# Patient Record
Sex: Female | Born: 1965
Health system: Southern US, Community
[De-identification: ages and names within clinical notes are randomized; demographics above are authoritative.]

## PROBLEM LIST (undated history)

## (undated) DIAGNOSIS — F32A Depression, unspecified: Secondary | ICD-10-CM

## (undated) DIAGNOSIS — F419 Anxiety disorder, unspecified: Secondary | ICD-10-CM

## (undated) HISTORY — PX: FOREARM FRACTURE SURGERY: SHX649

## (undated) HISTORY — PX: BREAST ENHANCEMENT SURGERY: SHX7

## (undated) HISTORY — DX: Depression, unspecified: F32.A

## (undated) HISTORY — PX: WISDOM TOOTH EXTRACTION: SHX21

## (undated) HISTORY — PX: TONSILLECTOMY: SUR1361

---

## 1993-03-18 HISTORY — PX: FOREARM FRACTURE SURGERY: SHX649

## 1998-04-05 ENCOUNTER — Inpatient Hospital Stay (HOSPITAL_COMMUNITY): Admission: AD | Admit: 1998-04-05 | Discharge: 1998-04-05 | Payer: Self-pay | Admitting: Obstetrics and Gynecology

## 1998-05-22 ENCOUNTER — Inpatient Hospital Stay (HOSPITAL_COMMUNITY): Admission: AD | Admit: 1998-05-22 | Discharge: 1998-05-22 | Payer: Self-pay | Admitting: Obstetrics and Gynecology

## 1998-05-24 ENCOUNTER — Inpatient Hospital Stay (HOSPITAL_COMMUNITY): Admission: AD | Admit: 1998-05-24 | Discharge: 1998-05-24 | Payer: Self-pay | Admitting: Obstetrics and Gynecology

## 1998-05-25 ENCOUNTER — Inpatient Hospital Stay (HOSPITAL_COMMUNITY): Admission: AD | Admit: 1998-05-25 | Discharge: 1998-05-27 | Payer: Self-pay | Admitting: *Deleted

## 1998-07-12 ENCOUNTER — Other Ambulatory Visit: Admission: RE | Admit: 1998-07-12 | Discharge: 1998-07-12 | Payer: Self-pay | Admitting: Obstetrics and Gynecology

## 1999-07-04 ENCOUNTER — Other Ambulatory Visit: Admission: RE | Admit: 1999-07-04 | Discharge: 1999-07-04 | Payer: Self-pay | Admitting: Obstetrics and Gynecology

## 1999-09-21 ENCOUNTER — Other Ambulatory Visit: Admission: RE | Admit: 1999-09-21 | Discharge: 1999-09-21 | Payer: Self-pay | Admitting: *Deleted

## 2000-07-03 ENCOUNTER — Other Ambulatory Visit: Admission: RE | Admit: 2000-07-03 | Discharge: 2000-07-03 | Payer: Self-pay | Admitting: *Deleted

## 2001-07-06 ENCOUNTER — Other Ambulatory Visit: Admission: RE | Admit: 2001-07-06 | Discharge: 2001-07-06 | Payer: Self-pay | Admitting: Obstetrics and Gynecology

## 2002-07-09 ENCOUNTER — Other Ambulatory Visit: Admission: RE | Admit: 2002-07-09 | Discharge: 2002-07-09 | Payer: Self-pay | Admitting: Obstetrics and Gynecology

## 2003-07-26 ENCOUNTER — Other Ambulatory Visit: Admission: RE | Admit: 2003-07-26 | Discharge: 2003-07-26 | Payer: Self-pay | Admitting: Obstetrics and Gynecology

## 2008-04-20 ENCOUNTER — Inpatient Hospital Stay (HOSPITAL_COMMUNITY): Admission: EM | Admit: 2008-04-20 | Discharge: 2008-04-22 | Payer: Self-pay | Admitting: Emergency Medicine

## 2008-04-21 ENCOUNTER — Ambulatory Visit: Payer: Self-pay | Admitting: Infectious Diseases

## 2010-04-07 ENCOUNTER — Encounter: Payer: Self-pay | Admitting: Emergency Medicine

## 2010-07-03 LAB — CBC
HCT: 36.5 % (ref 36.0–46.0)
Hemoglobin: 12.8 g/dL (ref 12.0–15.0)
Hemoglobin: 13.5 g/dL (ref 12.0–15.0)
MCHC: 35 g/dL (ref 30.0–36.0)
MCHC: 35 g/dL (ref 30.0–36.0)
MCV: 86 fL (ref 78.0–100.0)
RBC: 4.5 MIL/uL (ref 3.87–5.11)
RDW: 12.6 % (ref 11.5–15.5)

## 2010-07-03 LAB — DIFFERENTIAL
Basophils Absolute: 0 10*3/uL (ref 0.0–0.1)
Basophils Relative: 0 % (ref 0–1)
Basophils Relative: 0 % (ref 0–1)
Eosinophils Absolute: 0 10*3/uL (ref 0.0–0.7)
Eosinophils Relative: 0 % (ref 0–5)
Lymphocytes Relative: 4 % — ABNORMAL LOW (ref 12–46)
Monocytes Absolute: 0.2 10*3/uL (ref 0.1–1.0)
Monocytes Relative: 4 % (ref 3–12)
Neutro Abs: 19.6 10*3/uL — ABNORMAL HIGH (ref 1.7–7.7)

## 2010-07-03 LAB — WOUND CULTURE: Culture: NORMAL

## 2010-07-03 LAB — BASIC METABOLIC PANEL
BUN: 4 mg/dL — ABNORMAL LOW (ref 6–23)
CO2: 23 mEq/L (ref 19–32)
Calcium: 8.7 mg/dL (ref 8.4–10.5)
Chloride: 107 mEq/L (ref 96–112)
Creatinine, Ser: 0.67 mg/dL (ref 0.4–1.2)
GFR calc Af Amer: >60 mL/min (ref >60)
GFR calc non Af Amer: >60 mL/min (ref >60)
Glucose, Bld: 94 mg/dL (ref 70–99)
Potassium: 3.5 mEq/L (ref 3.5–5.1)
Sodium: 139 mEq/L (ref 135–145)

## 2010-07-03 LAB — RAPID STREP SCREEN (MED CTR MEBANE ONLY): Streptococcus, Group A Screen (Direct): NEGATIVE

## 2010-07-31 NOTE — Consult Note (Signed)
NAMEJANIYA, MILLIRONS                 ACCOUNT NO.:  [id number]   MEDICAL RECORD NO.:  [id number]          PATIENT TYPE:  INP   LOCATION:  3307                         FACILITY:  [facility]   PHYSICIAN:  Zola Button T. Lazarus Salines, M.D. DATE OF BIRTH:  1965-05-09   DATE OF CONSULTATION:  04/20/2008  DATE OF DISCHARGE:                                 CONSULTATION   CHIEF COMPLAINT:  Severe sore throat.   HISTORY:  A 45 year old white female developed a rapidly progressive  sore throat starting late yesterday.  By this morning, she got basically  no sleep overnight and could not swallow and went to Urgent Care.  She  received a shot of Rocephin.  At that time, a white count of 17,000 was  noted.  She was told to come back if things seemed to be getting worse.  Over the course of the day, the pain was worse and she came back to  Urgent Care and she was transferred to the [facility] Emergency Room for  further assessment.  She has never had a prior similar problem.  She has  had some fevers, but undocumented.  No trauma to the throat.  She does  not smoke.  She does not have any intrinsic lung problems.  She does not  have any immune compromise including diabetes, prednisone therapy, or  HIV status.  She has no medical allergies.  She is taking no current  medications.  No trauma to throat including foreign body ingestion,  intubation, or endoscopy.  She was evaluated in the emergency room where  her white count was 21,000.  Lateral soft tissue of the neck was clear  and a chest x-ray was also read as clear.  She did receive some IV  fluids for hydration this morning at Urgent Care and again this evening.  The emergency room physicians called me for possible adult epiglottitis.  A strep screen of her throat was negative and throat culture is pending.   No known allergies.   No current medications.   She has had a previous tonsillectomy, adenoidectomy, and also a surgical  repair of a right forearm  fracture.   SOCIAL HISTORY:  She is married with children.   FAMILY HISTORY:  Noncontributory.   PHYSICAL EXAMINATION:  This is a thin, anxious-appearing, adult white  female.  She is slightly warm to touch.  Her voice is slightly hoarse,  but she is not having labored or stridorous respirations.  She does  cough and clear her secretions and is unable to swallow her own saliva.  Mental status is sharp.  She hears well in conversational speech.  Respirations unlabored through mouth and nose.  The head is atraumatic  and neck supple.  Cranial nerves intact.  Ear canals are clear with  normal aerated drums.  Anterior nose shows some yellow mucopus in the  left nose.  Oral cavity is clear with teeth in good repair and moist  membranes.  Oropharynx is without erythema or exudate.  Neck is slightly  tender in the region of the laryngotracheal complex, but without  adenopathy.  Following 10 mL of 1% plain Xylocaine to both sides of the nose for  topical anesthesia, the flexible laryngoscope was gently introduced  through the right side.  The nasopharynx is clear, although there is  some yellow mucopus coming into the pharynx from the left side.  The  oropharynx is clear including base of tongue.  Hypopharynx shows  erythema and moderate swelling of the epiglottis and of the arytenoids,  right more so than left.  There may be some superficial ulceration over  the right arytenoid.  The vocal cords are mobile and the airway is good.  Some pooling in the piriformis.   IMPRESSION:  Adult epiglottitis/supraglottis without eminent airway  threat.   PLAN:  Throat culture is pending.  We will admit for airway observation  and IV antibiosis.  I discussed her case with Dr. Sampson Goon of  Infectious Disease, who recommends vancomycin plus ceftriaxone.  We will  allow her to have an occasional ice chip, but nothing by mouth further  than that.  We will recheck a CBC in the morning.      Gloris [place]. Lazarus Salines, M.D.  Electronically Signed     KTW/MEDQ  D:  04/20/2008  T:  04/21/2008  Job:  [id number]   cc:   Annye Rusk, MD  Payton Spark. Effie Shy, M.D.

## 2017-06-06 DIAGNOSIS — Z6823 Body mass index (BMI) 23.0-23.9, adult: Secondary | ICD-10-CM | POA: Diagnosis not present

## 2017-06-06 DIAGNOSIS — Z01419 Encounter for gynecological examination (general) (routine) without abnormal findings: Secondary | ICD-10-CM | POA: Diagnosis not present

## 2017-10-22 ENCOUNTER — Encounter (HOSPITAL_COMMUNITY): Payer: Self-pay

## 2017-10-22 ENCOUNTER — Other Ambulatory Visit: Payer: Self-pay

## 2017-10-22 ENCOUNTER — Emergency Department (HOSPITAL_COMMUNITY): Payer: 59

## 2017-10-22 ENCOUNTER — Observation Stay (HOSPITAL_COMMUNITY)
Admission: EM | Admit: 2017-10-22 | Discharge: 2017-10-24 | Disposition: A | Discharge status: Home / Self Care | Payer: 59 | Attending: Family Medicine | Admitting: Family Medicine

## 2017-10-22 DIAGNOSIS — R5381 Other malaise: Secondary | ICD-10-CM | POA: Diagnosis present

## 2017-10-22 DIAGNOSIS — R0902 Hypoxemia: Secondary | ICD-10-CM | POA: Diagnosis not present

## 2017-10-22 DIAGNOSIS — J189 Pneumonia, unspecified organism: Secondary | ICD-10-CM | POA: Diagnosis not present

## 2017-10-22 DIAGNOSIS — R740 Nonspecific elevation of levels of transaminase and lactic acid dehydrogenase [LDH]: Secondary | ICD-10-CM

## 2017-10-22 DIAGNOSIS — R7989 Other specified abnormal findings of blood chemistry: Secondary | ICD-10-CM | POA: Diagnosis not present

## 2017-10-22 DIAGNOSIS — R079 Chest pain, unspecified: Secondary | ICD-10-CM | POA: Diagnosis not present

## 2017-10-22 DIAGNOSIS — F419 Anxiety disorder, unspecified: Secondary | ICD-10-CM

## 2017-10-22 DIAGNOSIS — J181 Lobar pneumonia, unspecified organism: Secondary | ICD-10-CM | POA: Diagnosis not present

## 2017-10-22 DIAGNOSIS — R7401 Elevation of levels of liver transaminase levels: Secondary | ICD-10-CM

## 2017-10-22 DIAGNOSIS — R42 Dizziness and giddiness: Secondary | ICD-10-CM | POA: Diagnosis not present

## 2017-10-22 DIAGNOSIS — E876 Hypokalemia: Secondary | ICD-10-CM

## 2017-10-22 DIAGNOSIS — R0602 Shortness of breath: Secondary | ICD-10-CM | POA: Diagnosis not present

## 2017-10-22 HISTORY — DX: Anxiety disorder, unspecified: F41.9

## 2017-10-22 LAB — CBC WITH DIFFERENTIAL/PLATELET
BASOS PCT: 0 %
Basophils Absolute: 0 10*3/uL (ref 0.0–0.1)
Eosinophils Absolute: 0 10*3/uL (ref 0.0–0.7)
Eosinophils Relative: 0 %
HCT: 38.6 % (ref 36.0–46.0)
Hemoglobin: 13.7 g/dL (ref 12.0–15.0)
Lymphocytes Relative: 7 %
Lymphs Abs: 0.8 10*3/uL (ref 0.7–4.0)
MCH: 29 pg (ref 26.0–34.0)
MCHC: 35.5 g/dL (ref 30.0–36.0)
MCV: 81.8 fL (ref 78.0–100.0)
MONO ABS: 1 10*3/uL (ref 0.1–1.0)
MONOS PCT: 9 %
Neutro Abs: 8.4 10*3/uL — ABNORMAL HIGH (ref 1.7–7.7)
Neutrophils Relative %: 84 %
PLATELETS: 270 10*3/uL (ref 150–400)
RBC: 4.72 MIL/uL (ref 3.87–5.11)
RDW: 12.8 % (ref 11.5–15.5)
WBC: 10.2 10*3/uL (ref 4.0–10.5)

## 2017-10-22 LAB — COMPREHENSIVE METABOLIC PANEL
ALBUMIN: 2.9 g/dL — AB (ref 3.5–5.0)
ALT: 49 U/L — ABNORMAL HIGH (ref 0–44)
ANION GAP: 12 (ref 5–15)
AST: 53 U/L — ABNORMAL HIGH (ref 15–41)
Alkaline Phosphatase: 79 U/L (ref 38–126)
BUN: 13 mg/dL (ref 6–20)
CHLORIDE: 95 mmol/L — AB (ref 98–111)
CO2: 27 mmol/L (ref 22–32)
Calcium: 9 mg/dL (ref 8.9–10.3)
Creatinine, Ser: 0.61 mg/dL (ref 0.44–1.00)
GFR calc Af Amer: >60 mL/min (ref >60)
GFR calc non Af Amer: >60 mL/min (ref >60)
GLUCOSE: 125 mg/dL — AB (ref 70–99)
POTASSIUM: 3.2 mmol/L — AB (ref 3.5–5.1)
Sodium: 134 mmol/L — ABNORMAL LOW (ref 135–145)
TOTAL PROTEIN: 6.9 g/dL (ref 6.5–8.1)
Total Bilirubin: 0.6 mg/dL (ref 0.3–1.2)

## 2017-10-22 LAB — I-STAT BETA HCG BLOOD, ED (MC, WL, AP ONLY): I-stat hCG, quantitative: 58.2 m[IU]/mL — ABNORMAL HIGH (ref <5)

## 2017-10-22 LAB — I-STAT TROPONIN, ED
TROPONIN I, POC: 0 ng/mL (ref 0.00–0.08)
TROPONIN I, POC: 0 ng/mL (ref 0.00–0.08)

## 2017-10-22 LAB — COOXEMETRY PANEL
Carboxyhemoglobin: 0.9 % (ref 0.5–1.5)
Methemoglobin: 0.7 % (ref 0.0–1.5)
O2 SAT: 84.8 %
Total hemoglobin: 13.7 g/dL (ref 12.0–16.0)

## 2017-10-22 LAB — HCG, SERUM, QUALITATIVE: PREG SERUM: NEGATIVE

## 2017-10-22 LAB — LIPASE, BLOOD: Lipase: 21 U/L (ref 11–51)

## 2017-10-22 LAB — D-DIMER, QUANTITATIVE (NOT AT ARMC): D DIMER QUANT: 2.96 ug{FEU}/mL — AB (ref 0.00–0.50)

## 2017-10-22 LAB — I-STAT CG4 LACTIC ACID, ED
Lactic Acid, Venous: 0.81 mmol/L (ref 0.5–1.9)
Lactic Acid, Venous: 0.63 mmol/L (ref 0.5–1.9)

## 2017-10-22 MED ORDER — ONDANSETRON HCL 4 MG/2ML IJ SOLN
4.0000 mg | Freq: Four times a day (QID) | INTRAMUSCULAR | Status: DC | PRN
  Administered 2017-10-22 – 2017-10-23 (×3): 4 mg via INTRAVENOUS
  Filled 2017-10-22 (×3): qty 2

## 2017-10-22 MED ORDER — ACETAMINOPHEN 325 MG PO TABS
650.0000 mg | ORAL_TABLET | Freq: Once | ORAL | Status: AC
  Administered 2017-10-22: 650 mg via ORAL
  Filled 2017-10-22: qty 2

## 2017-10-22 MED ORDER — IOPAMIDOL (ISOVUE-370) INJECTION 76%
INTRAVENOUS | Status: AC
  Filled 2017-10-22: qty 100

## 2017-10-22 MED ORDER — SODIUM CHLORIDE 0.9 % IV BOLUS
1000.0000 mL | Freq: Once | INTRAVENOUS | Status: AC
  Administered 2017-10-22: 1000 mL via INTRAVENOUS

## 2017-10-22 MED ORDER — SODIUM CHLORIDE 0.9 % IV SOLN
500.0000 mg | Freq: Once | INTRAVENOUS | Status: AC
  Administered 2017-10-22: 500 mg via INTRAVENOUS
  Filled 2017-10-22: qty 500

## 2017-10-22 MED ORDER — ZOLPIDEM TARTRATE 5 MG PO TABS
5.0000 mg | ORAL_TABLET | Freq: Every evening | ORAL | Status: DC | PRN
  Administered 2017-10-22 – 2017-10-23 (×2): 5 mg via ORAL
  Filled 2017-10-22 (×2): qty 1

## 2017-10-22 MED ORDER — ACETAMINOPHEN 325 MG PO TABS
650.0000 mg | ORAL_TABLET | Freq: Four times a day (QID) | ORAL | Status: DC | PRN
  Administered 2017-10-22 – 2017-10-24 (×6): 650 mg via ORAL
  Filled 2017-10-22 (×6): qty 2

## 2017-10-22 MED ORDER — IOPAMIDOL (ISOVUE-370) INJECTION 76%
100.0000 mL | Freq: Once | INTRAVENOUS | Status: AC | PRN
  Administered 2017-10-22: 100 mL via INTRAVENOUS

## 2017-10-22 MED ORDER — SODIUM CHLORIDE 0.9 % IV SOLN
1.0000 g | INTRAVENOUS | Status: DC
  Administered 2017-10-23: 1 g via INTRAVENOUS
  Filled 2017-10-22: qty 1
  Filled 2017-10-22: qty 10

## 2017-10-22 MED ORDER — POTASSIUM CHLORIDE CRYS ER 20 MEQ PO TBCR
40.0000 meq | EXTENDED_RELEASE_TABLET | Freq: Once | ORAL | Status: AC
  Administered 2017-10-22: 40 meq via ORAL
  Filled 2017-10-22: qty 2

## 2017-10-22 MED ORDER — SODIUM CHLORIDE 0.9% FLUSH: 3.0000 mL | INTRAVENOUS | Status: DC | PRN

## 2017-10-22 MED ORDER — SODIUM CHLORIDE 0.9 % IV SOLN
1.0000 g | Freq: Once | INTRAVENOUS | Status: AC
  Administered 2017-10-22: 1 g via INTRAVENOUS
  Filled 2017-10-22: qty 1

## 2017-10-22 MED ORDER — ENOXAPARIN SODIUM 40 MG/0.4ML ~~LOC~~ SOLN
40.0000 mg | SUBCUTANEOUS | Status: DC
  Administered 2017-10-22 – 2017-10-23 (×2): 40 mg via SUBCUTANEOUS
  Filled 2017-10-22 (×2): qty 0.4

## 2017-10-22 MED ORDER — IOPAMIDOL (ISOVUE-370) INJECTION 76%: 100.0000 mL | Freq: Once | INTRAVENOUS | Status: DC | PRN

## 2017-10-22 MED ORDER — SODIUM CHLORIDE 0.9 % IV SOLN
INTRAVENOUS | Status: DC
  Administered 2017-10-22 – 2017-10-23 (×3): via INTRAVENOUS

## 2017-10-22 MED ORDER — AZITHROMYCIN 250 MG PO TABS
500.0000 mg | ORAL_TABLET | ORAL | Status: DC
  Administered 2017-10-23: 500 mg via ORAL
  Filled 2017-10-22: qty 2

## 2017-10-22 MED ORDER — ONDANSETRON HCL 4 MG PO TABS: 4.0000 mg | ORAL_TABLET | Freq: Four times a day (QID) | ORAL | Status: DC | PRN

## 2017-10-22 MED ORDER — SODIUM CHLORIDE 0.9 % IV SOLN: 250.0000 mL | INTRAVENOUS | Status: DC | PRN

## 2017-10-22 MED ORDER — HYDROCODONE-ACETAMINOPHEN 5-325 MG PO TABS
1.0000 | ORAL_TABLET | Freq: Once | ORAL | Status: AC
  Administered 2017-10-22: 1 via ORAL
  Filled 2017-10-22: qty 1

## 2017-10-22 MED ORDER — ALPRAZOLAM 1 MG PO TABS
1.0000 mg | ORAL_TABLET | Freq: Two times a day (BID) | ORAL | Status: DC | PRN
  Administered 2017-10-22 – 2017-10-23 (×3): 1 mg via ORAL
  Filled 2017-10-22 (×3): qty 1

## 2017-10-22 MED ORDER — SODIUM CHLORIDE 0.9% FLUSH
3.0000 mL | Freq: Two times a day (BID) | INTRAVENOUS | Status: DC
  Administered 2017-10-23: 3 mL via INTRAVENOUS

## 2017-10-22 NOTE — ED Notes (Signed)
Pt notified of urine sample.  Pt will try to urinate again after fluid bolus.

## 2017-10-22 NOTE — ED Notes (Addendum)
Unable to give report to floor at this time.  

## 2017-10-22 NOTE — ED Notes (Signed)
Pt reminded of ned for urine specimen
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
## 2017-10-22 NOTE — Progress Notes (Signed)
Attempted to call to get report from ED nurse -- first time I was kept on hold.  Second time, Diplomatic Services operational officersecretary reported that nurse was involved with a critically ill patient and unable to give report.
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
## 2017-10-22 NOTE — ED Notes (Signed)
Patient is resting comfortably. 

## 2017-10-22 NOTE — ED Notes (Signed)
Pt and pt's husband states that home health nurse is concerned pt has "general organ failure".   

## 2017-10-22 NOTE — ED Notes (Signed)
Bed: WA07 Expected date:  Expected time:  Means of arrival:  Comments: EMS- general weakness

## 2017-10-22 NOTE — ED Notes (Signed)
ED TO INPATIENT HANDOFF REPORT  Name/Age/Gender Melissa Lucas 52 y.o. female  Code Status    Code Status Orders  (From admission, onward)        Start     Ordered   10/22/17 1832  Full code  Continuous     10/22/17 1831    Code Status History    This patient has a current code status but no historical code status.      Home/SNF/Other Home  Chief Complaint weakness   Level of Care/Admitting Diagnosis ED Disposition    ED Disposition Condition Comment   Admit  Hospital Area: [facility] [[id number]]  Level of Care: Med-Surg [16]  Diagnosis: Right lower lobe pneumonia [facility]) [[id number]]  Admitting Physician: Mariel Aloe [phone]  Attending Physician: Mariel Aloe [phone]  PT Class (Do Not Modify): Observation [104]  PT Acc Code (Do Not Modify): Observation [10022]       Medical History Past Medical History:  Diagnosis Date  . Anxiety     Allergies No Known Allergies  IV Location/Drains/Wounds Patient Lines/Drains/Airways Status   Active Line/Drains/Airways    Name:   Placement date:   Placement time:   Site:   Days:   Peripheral IV 10/22/17 Left Antecubital   10/22/17    -    Antecubital   less than 1          Labs/Imaging Results for orders placed or performed during the hospital encounter of 10/22/17 (from the past 48 hour(s))  CBC with Differential     Status: Abnormal   Collection Time: 10/22/17 11:17 AM  Result Value Ref Range   WBC 10.2 4.0 - 10.5 K/uL   RBC 4.72 3.87 - 5.11 MIL/uL   Hemoglobin 13.7 12.0 - 15.0 g/dL   HCT 38.6 36.0 - 46.0 %   MCV 81.8 78.0 - 100.0 fL   MCH 29.0 26.0 - 34.0 pg   MCHC 35.5 30.0 - 36.0 g/dL   RDW 12.8 11.5 - 15.5 %   Platelets 270 150 - 400 K/uL   Neutrophils Relative % 84 %   Neutro Abs 8.4 (H) 1.7 - 7.7 K/uL   Lymphocytes Relative 7 %   Lymphs Abs 0.8 0.7 - 4.0 K/uL   Monocytes Relative 9 %   Monocytes Absolute 1.0 0.1 - 1.0 K/uL   Eosinophils Relative 0 %   Eosinophils Absolute 0.0  0.0 - 0.7 K/uL   Basophils Relative 0 %   Basophils Absolute 0.0 0.0 - 0.1 K/uL    Comment: Performed at [facility], [place] [address] [place], [place] [address]  Comprehensive metabolic panel     Status: Abnormal   Collection Time: 10/22/17 11:17 AM  Result Value Ref Range   Sodium 134 (L) 135 - 145 mmol/L   Potassium 3.2 (L) 3.5 - 5.1 mmol/L   Chloride 95 (L) 98 - 111 mmol/L   CO2 27 22 - 32 mmol/L   Glucose, Bld 125 (H) 70 - 99 mg/dL   BUN 13 6 - 20 mg/dL   Creatinine, Ser 0.61 0.44 - 1.00 mg/dL   Calcium 9.0 8.9 - 10.3 mg/dL   Total Protein 6.9 6.5 - 8.1 g/dL   Albumin 2.9 (L) 3.5 - 5.0 g/dL   AST 53 (H) 15 - 41 U/L   ALT 49 (H) 0 - 44 U/L   Alkaline Phosphatase 79 38 - 126 U/L   Total Bilirubin 0.6 0.3 - 1.2 mg/dL   GFR calc non  Af Amer >60 >60 mL/min   GFR calc Af Amer >60 >60 mL/min    Comment: (NOTE) The eGFR has been calculated using the CKD EPI equation. This calculation has not been validated in all clinical situations. eGFR's persistently <60 mL/min signify possible Chronic Kidney Disease.    Anion gap 12 5 - 15    Comment: Performed at [facility], [place] [address], [place], [place] [address]  Lipase, blood     Status: None   Collection Time: 10/22/17 11:17 AM  Result Value Ref Range   Lipase 21 11 - 51 U/L    Comment: Performed at [facility], [place] [address], [place], [place] [address]  D-dimer, quantitative (not at [facility])     Status: Abnormal   Collection Time: 10/22/17 11:17 AM  Result Value Ref Range   D-Dimer, Quant 2.96 (H) 0.00 - 0.50 ug/mL-FEU    Comment: (NOTE) At the manufacturer cut-off of 0.50 ug/mL FEU, this assay has been documented to exclude PE with a sensitivity and negative predictive value of 97 to 99%.  At this time, this assay has not been approved by the FDA to exclude DVT/VTE. Results should be correlated with clinical presentation. Performed at [facility],  [place] [address] [place], [place] [address]   .Cooxemetry Panel (carboxy, met, total hgb, O2 sat)     Status: None   Collection Time: 10/22/17 11:17 AM  Result Value Ref Range   Total hemoglobin 13.7 12.0 - 16.0 g/dL   O2 Saturation 84.8 %   Carboxyhemoglobin 0.9 0.5 - 1.5 %   Methemoglobin 0.7 0.0 - 1.5 %    Comment: Performed at [facility], [place] [address] [place], [place] [address]  I-stat troponin, ED     Status: None   Collection Time: 10/22/17 11:27 AM  Result Value Ref Range   Troponin i, poc 0.00 0.00 - 0.08 ng/mL   Comment 3            Comment: Due to the release kinetics of cTnI, a negative result within the first hours of the onset of symptoms does not rule out myocardial infarction with certainty. If myocardial infarction is still suspected, repeat the test at appropriate intervals.   I-Stat CG4 Lactic Acid, ED     Status: None   Collection Time: 10/22/17 11:29 AM  Result Value Ref Range   Lactic Acid, Venous 0.81 0.5 - 1.9 mmol/L  I-stat troponin, ED     Status: None   Collection Time: 10/22/17  2:16 PM  Result Value Ref Range   Troponin i, poc 0.00 0.00 - 0.08 ng/mL   Comment 3            Comment: Due to the release kinetics of cTnI, a negative result within the first hours of the onset of symptoms does not rule out myocardial infarction with certainty. If myocardial infarction is still suspected, repeat the test at appropriate intervals.   I-Stat CG4 Lactic Acid, ED     Status: None   Collection Time: 10/22/17  2:18 PM  Result Value Ref Range   Lactic Acid, Venous 0.63 0.5 - 1.9 mmol/L  I-Stat Beta hCG blood, ED ([facility], [facility], [facility] only)     Status: Abnormal   Collection Time: 10/22/17  2:19 PM  Result Value Ref Range   I-stat hCG, quantitative 58.2 (H) <5 mIU/mL   Comment 3            Comment:  GEST. AGE      CONC.  (mIU/mL)   <=1 WEEK        5 - 50     2 WEEKS       50 - 500     3 WEEKS       100 - 10,000     4 WEEKS     1,000 -  30,000        FEMALE AND NON-PREGNANT FEMALE:     LESS THAN 5 mIU/mL   hCG, serum, qualitative     Status: None   Collection Time: 10/22/17  2:31 PM  Result Value Ref Range   Preg, Serum NEGATIVE NEGATIVE    Comment:        THE SENSITIVITY OF THIS METHODOLOGY IS >10 mIU/mL. Performed at [facility], [place] [address], [place], [place] [address]    Dg Chest 2 View  Result Date: 10/22/2017 CLINICAL DATA:  Chest pain, nausea, vomiting, shortness of breath and fever since Friday, possible carbon monoxide poisoning per EMS EXAM: CHEST - 2 VIEW COMPARISON:  04/20/2008 FINDINGS: Normal heart size, mediastinal contours, and pulmonary vascularity. RIGHT lower lobe consolidation consistent with pneumonia. Remaining lungs clear. No pleural effusion or pneumothorax. Broad-based dextroconvex thoracic scoliosis. IMPRESSION: RIGHT lower lobe infiltrate consistent with pneumonia. Electronically Signed   By: Lavonia Dana M.D.   On: 10/22/2017 12:43   Ct Angio Chest Pe W And/or Wo Contrast  Result Date: 10/22/2017 CLINICAL DATA:  Dyspnea, angina for 5 days.  Positive D-dimer. EXAM: CT ANGIOGRAPHY CHEST WITH CONTRAST TECHNIQUE: Multidetector CT imaging of the chest was performed using the standard protocol during bolus administration of intravenous contrast. Multiplanar CT image reconstructions and MIPs were obtained to evaluate the vascular anatomy. CONTRAST:  152m ISOVUE-370 IOPAMIDOL (ISOVUE-370) INJECTION 76% COMPARISON:  Chest radiograph October 22, 2017 FINDINGS: CARDIOVASCULAR: Adequate contrast opacification of the pulmonary artery's. Main pulmonary artery is not enlarged. No pulmonary arterial filling defects to the level of the subsegmental branches. Heart size is normal, no right heart strain. Trace pericardial effusion. Thoracic aorta is normal course and caliber, unremarkable. MEDIASTINUM/NODES: No lymphadenopathy by CT size criteria. LUNGS/PLEURA: Tracheobronchial tree is patent, no  pneumothorax. RIGHT greater LEFT bronchial wall thickening. Dense hypoenhancing consolidation RIGHT lower lobe cord syrinx bonding to radiographic abnormality. Small RIGHT pleural effusion. Mildly elevated RIGHT hemidiaphragm inferring a component of RIGHT lower lobe atelectasis. Mild heterogeneous lung attenuation compatible with small airway disease. UPPER ABDOMEN: Nonacute. Focal hepatic fatty infiltration about the falciform ligament. MUSCULOSKELETAL: Nonacute. Bilateral breast implants. Mild broad dextroscoliosis. Review of the MIP images confirms the above findings. IMPRESSION: 1. No acute pulmonary embolism. 2. RIGHT lower lobe bronchopneumonia with small RIGHT pleural effusion. Electronically Signed   By: CElon AlasM.D.   On: 10/22/2017 14:34    Pending Labs Unresulted Labs (From admission, onward)   Start     Ordered   10/29/17 0500  Creatinine, serum  (enoxaparin (LOVENOX)    CrCl >/= 30 ml/min)  Weekly,   R    Comments:  while on enoxaparin therapy    10/22/17 1831   10/23/17 0500  Comprehensive metabolic panel  Tomorrow morning,   R     10/22/17 1831   10/22/17 1832  Culture, sputum-assessment  Once,   R     10/22/17 1831   10/22/17 1832  Gram stain  Once,   R     10/22/17 1831   10/22/17 1832  HIV antibody (Routine Screening)  Once,  R     10/22/17 1831   10/22/17 1832  Strep pneumoniae urinary antigen  Once,   R     10/22/17 1831   10/22/17 1053  Blood culture (routine x 2)  BLOOD CULTURE X 2,   STAT     10/22/17 1053   10/22/17 1052  Urinalysis, Routine w reflex microscopic  Once,   R     10/22/17 1053   10/22/17 1052  Urine culture  STAT,   STAT     10/22/17 1053      Vitals/Pain Today's Vitals   10/22/17 1127 10/22/17 1305 10/22/17 1307 10/22/17 1526  BP:  100/66  97/69  Pulse:  82  78  Resp:  16  15  Temp: (!) 103.3 F (39.6 C)  98.9 F (37.2 C)   TempSrc: Rectal  Oral   SpO2:  94%  91%  Weight:      Height:      PainSc:        Isolation  Precautions No active isolations  Medications Medications  iopamidol (ISOVUE-370) 76 % injection (has no administration in time range)  cefTRIAXone (ROCEPHIN) 1 g in sodium chloride 0.9 % 100 mL IVPB (has no administration in time range)  azithromycin (ZITHROMAX) 500 mg in sodium chloride 0.9 % 250 mL IVPB (has no administration in time range)  sodium chloride 0.9 % bolus 1,000 mL (has no administration in time range)  ALPRAZolam (XANAX) tablet 1 mg (has no administration in time range)  enoxaparin (LOVENOX) injection 40 mg (has no administration in time range)  sodium chloride flush (NS) 0.9 % injection 3 mL (has no administration in time range)  sodium chloride flush (NS) 0.9 % injection 3 mL (has no administration in time range)  0.9 %  sodium chloride infusion (has no administration in time range)  cefTRIAXone (ROCEPHIN) 1 g in sodium chloride 0.9 % 100 mL IVPB (has no administration in time range)  azithromycin (ZITHROMAX) tablet 500 mg (has no administration in time range)  potassium chloride SA (K-DUR,KLOR-CON) CR tablet 40 mEq (has no administration in time range)  0.9 %  sodium chloride infusion (has no administration in time range)  sodium chloride 0.9 % bolus 1,000 mL (1,000 mLs Intravenous New Bag/Given 10/22/17 1129)  acetaminophen (TYLENOL) tablet 650 mg (650 mg Oral Given 10/22/17 1127)  iopamidol (ISOVUE-370) 76 % injection 100 mL (100 mLs Intravenous Contrast Given 10/22/17 1417)    Mobility walks

## 2017-10-22 NOTE — ED Provider Notes (Signed)
[facility]-EMERGENCY DEPT Provider Note   CSN: [id number] Arrival date & time: 10/22/17  4098     History   Chief Complaint Chief Complaint  Patient presents with  . generalized weakeness  . Nausea  . Dizziness  . Shortness of Breath    HPI Melissa Lucas is a 52 y.o. female.  The history is provided by the patient and medical records. No language interpreter was used.  Cough  This is a new problem. The current episode started more than 2 days ago. The problem occurs constantly. The problem has not changed since onset.The cough is productive of sputum. The maximum temperature recorded prior to her arrival was 100 to 100.9 F. Associated symptoms include chest pain, chills, rhinorrhea and shortness of breath. Pertinent negatives include no headaches, no sore throat and no wheezing. She has tried nothing for the symptoms. The treatment provided no relief. She is not a smoker. Her past medical history does not include COPD or asthma.    History reviewed. No pertinent past medical history.  There are no active problems to display for this patient.   History reviewed. No pertinent surgical history.   OB History    Gravida  1   Para  1   Term  1   Preterm      AB      Living  1     SAB      TAB      Ectopic      Multiple      Live Births               Home Medications    Prior to Admission medications   Medication Sig Start Date End Date Taking? Authorizing Provider  ALPRAZolam Prudy Feeler) 1 MG tablet Take 1 mg by mouth 2 (two) times daily as needed for anxiety.  10/01/17  Yes [provider]  amphetamine-dextroamphetamine (ADDERALL) 30 MG tablet Take 1 tablet by mouth daily as needed (anxiety).  10/03/17  Yes [provider]  zaleplon (SONATA) 10 MG capsule Take 10 mg by mouth at bedtime as needed for sleep.  09/09/17  Yes [provider]    Family History History reviewed. No pertinent family  history.  Social History Social History   Tobacco Use  . Smoking status: Never Smoker  . Smokeless tobacco: Never Used  Substance Use Topics  . Alcohol use: Yes    Comment: occasional drink  . Drug use: Never     Allergies   Patient has no known allergies.   Review of Systems Review of Systems  Constitutional: Positive for chills, fatigue and fever. Negative for diaphoresis.  HENT: Positive for congestion and rhinorrhea. Negative for sore throat.   Eyes: Negative for photophobia and visual disturbance.  Respiratory: Positive for cough, chest tightness and shortness of breath. Negative for wheezing and stridor.   Cardiovascular: Positive for chest pain.  Gastrointestinal: Positive for abdominal pain (mild lower), nausea and vomiting. Negative for constipation and diarrhea.  Genitourinary: Negative for decreased urine volume, dysuria, flank pain, frequency, vaginal bleeding, vaginal discharge and vaginal pain.  Musculoskeletal: Negative for back pain, neck pain and neck stiffness.  Skin: Negative for rash and wound.  Neurological: Negative for dizziness, seizures, speech difficulty, light-headedness, numbness and headaches.  Psychiatric/Behavioral: Negative for agitation.  All other systems reviewed and are negative.    Physical Exam Updated Vital Signs BP 116/66 (BP Location: Right Arm)   Pulse (!) 104   Temp (!)  100.8 F (38.2 C) (Oral)   Resp 17   Ht 5\' 8"  (1.727 m)   Wt 64.4 kg (142 lb)   SpO2 97%   BMI 21.59 kg/m   Physical Exam  Constitutional: She is oriented to person, place, and time. She appears well-developed and well-nourished.  Non-toxic appearance. She does not appear ill. No distress.  HENT:  Head: Normocephalic and atraumatic.  Mouth/Throat: Oropharynx is clear and moist. No oropharyngeal exudate.  Eyes: Pupils are equal, round, and reactive to light. Conjunctivae and EOM are normal.  Neck: Normal range of motion. Neck supple.  Cardiovascular:  Regular rhythm. Tachycardia present.  No murmur heard. Pulmonary/Chest: Tachypnea noted. No respiratory distress. She has no wheezes. She has rhonchi in the right middle field and the right lower field. She exhibits no tenderness.  Abdominal: Soft. She exhibits no distension. There is no tenderness. There is no guarding.  Musculoskeletal: She exhibits no edema or tenderness.  Lymphadenopathy:    She has no cervical adenopathy.  Neurological: She is alert and oriented to person, place, and time. No cranial nerve deficit or sensory deficit. She exhibits normal muscle tone.  Skin: Skin is warm and dry. Capillary refill takes less than 2 seconds. No rash noted. She is not diaphoretic. No erythema.  Psychiatric: She has a normal mood and affect.  Nursing note and vitals reviewed.    ED Treatments / Results  Labs (all labs ordered are listed, but only abnormal results are displayed) Labs Reviewed  CBC WITH DIFFERENTIAL/PLATELET - Abnormal; Notable for the following components:      Result Value   Neutro Abs 8.4 (*)    All other components within normal limits  COMPREHENSIVE METABOLIC PANEL - Abnormal; Notable for the following components:   Sodium 134 (*)    Potassium 3.2 (*)    Chloride 95 (*)    Glucose, Bld 125 (*)    Albumin 2.9 (*)    AST 53 (*)    ALT 49 (*)    All other components within normal limits  D-DIMER, QUANTITATIVE (NOT AT [facility]) - Abnormal; Notable for the following components:   D-Dimer, Quant 2.96 (*)    All other components within normal limits  I-STAT BETA HCG BLOOD, ED ([facility], [facility], [facility] ONLY) - Abnormal; Notable for the following components:   I-stat hCG, quantitative 58.2 (*)    All other components within normal limits  URINE CULTURE  CULTURE, BLOOD (ROUTINE X 2)  CULTURE, BLOOD (ROUTINE X 2)  LIPASE, BLOOD  COOXEMETRY PANEL  HCG, SERUM, QUALITATIVE  URINALYSIS, ROUTINE W REFLEX MICROSCOPIC  I-STAT CG4 LACTIC ACID, ED  I-STAT TROPONIN, ED  I-STAT CG4 LACTIC  ACID, ED  I-STAT TROPONIN, ED    EKG None  Radiology Dg Chest 2 View  Result Date: 10/22/2017 CLINICAL DATA:  Chest pain, nausea, vomiting, shortness of breath and fever since Friday, possible carbon monoxide poisoning per EMS EXAM: CHEST - 2 VIEW COMPARISON:  04/20/2008 FINDINGS: Normal heart size, mediastinal contours, and pulmonary vascularity. RIGHT lower lobe consolidation consistent with pneumonia. Remaining lungs clear. No pleural effusion or pneumothorax. Broad-based dextroconvex thoracic scoliosis. IMPRESSION: RIGHT lower lobe infiltrate consistent with pneumonia. Electronically Signed   By: Ulyses Southward M.D.   On: 10/22/2017 12:43   Ct Angio Chest Pe W And/or Wo Contrast  Result Date: 10/22/2017 CLINICAL DATA:  Dyspnea, angina for 5 days.  Positive D-dimer. EXAM: CT ANGIOGRAPHY CHEST WITH CONTRAST TECHNIQUE: Multidetector CT imaging of the chest was performed using the standard  protocol during bolus administration of intravenous contrast. Multiplanar CT image reconstructions and MIPs were obtained to evaluate the vascular anatomy. CONTRAST:  ISOVUE-370 IOPAMIDOL (ISOVUE-370) INJECTION 76% COMPARISON:  Chest radiograph October 22, 2017 FINDINGS: CARDIOVASCULAR: Adequate contrast opacification of the pulmonary artery's. Main pulmonary artery is not enlarged. No pulmonary arterial filling defects to the level of the subsegmental branches. Heart size is normal, no right heart strain. Trace pericardial effusion. Thoracic aorta is normal course and caliber, unremarkable. MEDIASTINUM/NODES: No lymphadenopathy by CT size criteria. LUNGS/PLEURA: Tracheobronchial tree is patent, no pneumothorax. RIGHT greater LEFT bronchial wall thickening. Dense hypoenhancing consolidation RIGHT lower lobe cord syrinx bonding to radiographic abnormality. Small RIGHT pleural effusion. Mildly elevated RIGHT hemidiaphragm inferring a component of RIGHT lower lobe atelectasis. Mild heterogeneous lung attenuation  compatible with small airway disease. UPPER ABDOMEN: Nonacute. Focal hepatic fatty infiltration about the falciform ligament. MUSCULOSKELETAL: Nonacute. Bilateral breast implants. Mild broad dextroscoliosis. Review of the MIP images confirms the above findings. IMPRESSION: 1. No acute pulmonary embolism. 2. RIGHT lower lobe bronchopneumonia with small RIGHT pleural effusion. Electronically Signed   By: Awilda Metro M.D.   On: 10/22/2017 14:34    Procedures Procedures (including critical care time)  Medications Ordered in ED Medications  iopamidol (ISOVUE-370) 76 % injection 100 mL (has no administration in time range)  iopamidol (ISOVUE-370) 76 % injection (has no administration in time range)  cefTRIAXone (ROCEPHIN) 1 g in sodium chloride 0.9 % 100 mL IVPB (has no administration in time range)  azithromycin (ZITHROMAX) 500 mg in sodium chloride 0.9 % 250 mL IVPB (has no administration in time range)  sodium chloride 0.9 % bolus 1,000 mL (has no administration in time range)  sodium chloride 0.9 % bolus 1,000 mL (1,000 mLs Intravenous New Bag/Given 10/22/17 1129)  acetaminophen (TYLENOL) tablet 650 mg (650 mg Oral Given 10/22/17 1127)  iopamidol (ISOVUE-370) 76 % injection 100 mL (100 mLs Intravenous Contrast Given 10/22/17 1417)     Initial Impression / Assessment and Plan / ED Course  I have reviewed the triage vital signs and the nursing notes.  Pertinent labs & imaging results that were available during my care of the patient were reviewed by me and considered in my medical decision making (see chart for details).     DORIAN RENFRO is a 52 y.o. female with no significant past medical history who presents with fevers, chills, chest pain, shortness of breath, productive cough, nausea, vomiting, myalgias, and possible natural gas exposure.  Patient reports that she has been having a sensation of "feeling off" for the last 5 days.  She reports that she has had some fevers and chills and has  had a productive cough with yellow phlegm.  She says that she was having general malaise and some dizziness today prompting her to seek evaluation.  Fire department came and responded and thought they smelled natural gas.  They report that their sensors did not show elevated CO levels however patient reports that EMS found elevated CO readings on her.  Patient denies running her car in the house, using any generators, or any new exposures.  Patient does not smoke.  She denies any medications or other complaints.  She denies any upper abdominal pain but reports occasional lower abdominal cramping.  She denies any vaginal discharge vaginal bleeding and is not interested in pelvic exam today.  She denies any other complaints.  She does report that her chest pain has a sharp pleuritic chest pain in her central chest that  radiates around the sides slightly.  No history of DVT or PE.  She does report she was previously on estrogen replacement but is currently not on it.  On exam, chest is tender in the central chest.  Patient has coarse breath sounds in the right lower lobe compared to the left.  No wheezing.  Patient had no abdominal tenderness.  Patient is very warm to the touch.  Initial temperature was slightly febrile orally, will obtain rectal temp.  Clinical I am concerned about pneumonia given the vital signs with tachycardia, tachypnea, and the fever.  Patient will have work-up to look for occult infection as well as a cooximeter level to look for carbon monoxide poisoning.  Anticipate reassessment after work-up.  Patient was given Tylenol for fever.  Patient's fever improved with Tylenol.  Patient's serum pregnancy test was negative.  Lactic acid was negative and troponin was negative however, patient was found to have a right lower lobe pneumonia.  D-dimer was elevated so a PE study was ordered and confirmed the pneumonia with no PE.,  Oximetry panel was reassuring, no evidence of carbon monoxide  poisoning or other abnormality.  Initially, patient's blood pressures were in the 100s however they dropped into the 90s after some fluids.  She will give more fluids.  We discussed possibility of outpatient management for the pneumonia however patient reports she is feeling too tired and weak to ambulate still feels short of breath with a cough, and has a soft blood pressures.    Patient be given antibiotics for community associated pneumonia and will be called for admission for symptomatic pneumonia.   Final Clinical Impressions(s) / ED Diagnoses   Final diagnoses:  Community acquired pneumonia of right lower lobe of lung [facility](HCC)    ED Discharge Orders    None      Clinical Impression: 1. Community acquired pneumonia of right lower lobe of lung (HCC)     Disposition: Admit  This note was prepared with assistance of Conservation officer, historic buildingsDragon voice recognition software. Occasional wrong-word or sound-a-like substitutions may have occurred due to the inherent limitations of voice recognition software.     Leeah Politano, Canary Brimhristopher J, MD 10/22/17 [phone]

## 2017-10-23 DIAGNOSIS — J181 Lobar pneumonia, unspecified organism: Secondary | ICD-10-CM | POA: Diagnosis not present

## 2017-10-23 DIAGNOSIS — R740 Nonspecific elevation of levels of transaminase and lactic acid dehydrogenase [LDH]: Secondary | ICD-10-CM | POA: Diagnosis not present

## 2017-10-23 LAB — URINALYSIS, ROUTINE W REFLEX MICROSCOPIC
BILIRUBIN URINE: NEGATIVE
Glucose, UA: NEGATIVE mg/dL
Hgb urine dipstick: NEGATIVE
Ketones, ur: NEGATIVE mg/dL
LEUKOCYTES UA: NEGATIVE
NITRITE: NEGATIVE
Protein, ur: NEGATIVE mg/dL
SPECIFIC GRAVITY, URINE: 1.017 (ref 1.005–1.030)
pH: 6 (ref 5.0–8.0)

## 2017-10-23 LAB — COMPREHENSIVE METABOLIC PANEL
ALBUMIN: 2.8 g/dL — AB (ref 3.5–5.0)
ALT: 50 U/L — AB (ref 0–44)
AST: 65 U/L — AB (ref 15–41)
Alkaline Phosphatase: 69 U/L (ref 38–126)
Anion gap: 8 (ref 5–15)
BUN: 9 mg/dL (ref 6–20)
CHLORIDE: 104 mmol/L (ref 98–111)
CO2: 27 mmol/L (ref 22–32)
CREATININE: 0.68 mg/dL (ref 0.44–1.00)
Calcium: 8.5 mg/dL — ABNORMAL LOW (ref 8.9–10.3)
GFR calc Af Amer: >60 mL/min (ref >60)
GFR calc non Af Amer: >60 mL/min (ref >60)
Glucose, Bld: 110 mg/dL — ABNORMAL HIGH (ref 70–99)
POTASSIUM: 3.4 mmol/L — AB (ref 3.5–5.1)
Sodium: 139 mmol/L (ref 135–145)
Total Bilirubin: 0.5 mg/dL (ref 0.3–1.2)
Total Protein: 6.5 g/dL (ref 6.5–8.1)

## 2017-10-23 LAB — STREP PNEUMONIAE URINARY ANTIGEN: Strep Pneumo Urinary Antigen: NEGATIVE

## 2017-10-23 LAB — HIV ANTIBODY (ROUTINE TESTING W REFLEX): HIV SCREEN 4TH GENERATION: NONREACTIVE

## 2017-10-23 MED ORDER — HYDROXYZINE HCL 25 MG PO TABS: 25.0000 mg | ORAL_TABLET | Freq: Four times a day (QID) | ORAL | Status: DC | PRN

## 2017-10-23 MED ORDER — POTASSIUM CHLORIDE CRYS ER 20 MEQ PO TBCR
40.0000 meq | EXTENDED_RELEASE_TABLET | Freq: Once | ORAL | Status: AC
  Administered 2017-10-23: 40 meq via ORAL
  Filled 2017-10-23: qty 2

## 2017-10-23 MED ORDER — MELATONIN 3 MG PO TABS
3.0000 mg | ORAL_TABLET | Freq: Every evening | ORAL | Status: DC | PRN
  Filled 2017-10-23: qty 1

## 2017-10-23 NOTE — Progress Notes (Signed)
PROGRESS NOTE    Melissa Lucas  [id number] DOB: 09/03/1965 DOA: 10/22/2017 PCP: Trey SailorsPa, [facility]   Brief Narrative: Melissa Lucas is a 52 y.o. female with medical history significant of anxiety. She presented with malaise and fever found to have a right lower lobe pneumonia. Started on CAP treatment.   Assessment & Plan:   Principal Problem:   Right lower lobe pneumonia (HCC) Active Problems:   Anxiety   Elevated AST (SGOT)   Elevated alanine aminotransferase (ALT) level   Hypokalemia   Right lower lobe pneumonia Improved but still not at baseline functionally. Blood cultures pending. -Continue ceftriaxone and azithromycin -Blood cultures -Sputum gram stain and culture; urine strep -PT eval -IV fluids  Anxiety -Continue xanax prn  Elevated AST/ALT In setting of acute illness. Stable. Outpatient follow-up  Hypokalemia -Potassium supplementation   DVT prophylaxis: Lovenox Code Status:   Code Status: Full Code Family Communication: None at bedside Disposition Plan: Discharge tomorrow pending results of blood cultures   Consultants:   None  Procedures:   None  Antimicrobials:  Ceftriaxone  Azithromycin    Subjective: Feels somewhat better but still fatigued. Chills this afternoon.  Objective: Vitals:   10/22/17 1834 10/22/17 1903 10/22/17 2051 10/23/17 0500  BP: 118/76 122/75 108/66 116/72  Pulse: 100 97 91 86  Resp: 18 16 16 15   Temp:  (!) 103.1 F (39.5 C) 100 F (37.8 C) 98.9 F (37.2 C)  TempSrc:  Oral Oral Oral  SpO2: 95% 96% 95% 96%  Weight:  62.7 kg    Height:  5\' 8"  (1.727 m)      Intake/Output Summary (Last 24 hours) at 10/23/2017 1308 Last data filed at 10/23/2017 1100 Gross per 24 hour  Intake 1434.09 ml  Output 925 ml  Net 509.09 ml   Filed Weights   10/22/17 1010 10/22/17 1903  Weight: 64.4 kg 62.7 kg    Examination:  General exam: Appears calm and comfortable Respiratory system: Clear to  auscultation. Respiratory effort normal. Cardiovascular system: S1 & S2 heard, RRR. No murmurs, rubs, gallops or clicks. Gastrointestinal system: Abdomen is nondistended, soft and nontender. Normal bowel sounds heard. Central nervous system: Alert and oriented. No focal neurological deficits. Extremities: No edema. No calf tenderness Skin: No cyanosis. No rashes Psychiatry: Judgement and insight appear normal. Mood & affect appropriate.     Data Reviewed: I have personally reviewed following labs and imaging studies  CBC: Recent Labs  Lab 10/22/17 1117  WBC 10.2  NEUTROABS 8.4*  HGB 13.7  HCT 38.6  MCV 81.8  PLT 270   Basic Metabolic Panel: Recent Labs  Lab 10/22/17 1117 10/23/17 0411  NA 134* 139  K 3.2* 3.4*  CL 95* 104  CO2 27 27  GLUCOSE 125* 110*  BUN 13 9  CREATININE 0.61 0.68  CALCIUM 9.0 8.5*   GFR: Estimated Creatinine Clearance: 81.4 mL/min (by C-G formula based on SCr of 0.68 mg/dL). Liver Function Tests: Recent Labs  Lab 10/22/17 1117 10/23/17 0411  AST 53* 65*  ALT 49* 50*  ALKPHOS 79 69  BILITOT 0.6 0.5  PROT 6.9 6.5  ALBUMIN 2.9* 2.8*   Recent Labs  Lab 10/22/17 1117  LIPASE 21   No results for input(s): AMMONIA in the last 168 hours. Coagulation Profile: No results for input(s): INR, PROTIME in the last 168 hours. Cardiac Enzymes: No results for input(s): CKTOTAL, CKMB, CKMBINDEX, TROPONINI in the last 168 hours. BNP (last 3 results) No results for input(s): PROBNP  in the last 8760 hours. HbA1C: No results for input(s): HGBA1C in the last 72 hours. CBG: No results for input(s): GLUCAP in the last 168 hours. Lipid Profile: No results for input(s): CHOL, HDL, LDLCALC, TRIG, CHOLHDL, LDLDIRECT in the last 72 hours. Thyroid Function Tests: No results for input(s): TSH, T4TOTAL, FREET4, T3FREE, THYROIDAB in the last 72 hours. Anemia Panel: No results for input(s): VITAMINB12, FOLATE, FERRITIN, TIBC, IRON, RETICCTPCT in the last 72  hours. Sepsis Labs: Recent Labs  Lab 10/22/17 1129 10/22/17 1418  LATICACIDVEN 0.81 0.63    No results found for this or any previous visit (from the past 240 hour(s)).       Radiology Studies: Dg Chest 2 View  Result Date: 10/22/2017 CLINICAL DATA:  Chest pain, nausea, vomiting, shortness of breath and fever since Friday, possible carbon monoxide poisoning per EMS EXAM: CHEST - 2 VIEW COMPARISON:  04/20/2008 FINDINGS: Normal heart size, mediastinal contours, and pulmonary vascularity. RIGHT lower lobe consolidation consistent with pneumonia. Remaining lungs clear. No pleural effusion or pneumothorax. Broad-based dextroconvex thoracic scoliosis. IMPRESSION: RIGHT lower lobe infiltrate consistent with pneumonia. Electronically Signed   By: Ulyses Southward M.D.   On: 10/22/2017 12:43   Ct Angio Chest Pe W And/or Wo Contrast  Result Date: 10/22/2017 CLINICAL DATA:  Dyspnea, angina for 5 days.  Positive D-dimer. EXAM: CT ANGIOGRAPHY CHEST WITH CONTRAST TECHNIQUE: Multidetector CT imaging of the chest was performed using the standard protocol during bolus administration of intravenous contrast. Multiplanar CT image reconstructions and MIPs were obtained to evaluate the vascular anatomy. CONTRAST:  ISOVUE-370 IOPAMIDOL (ISOVUE-370) INJECTION 76% COMPARISON:  Chest radiograph October 22, 2017 FINDINGS: CARDIOVASCULAR: Adequate contrast opacification of the pulmonary artery's. Main pulmonary artery is not enlarged. No pulmonary arterial filling defects to the level of the subsegmental branches. Heart size is normal, no right heart strain. Trace pericardial effusion. Thoracic aorta is normal course and caliber, unremarkable. MEDIASTINUM/NODES: No lymphadenopathy by CT size criteria. LUNGS/PLEURA: Tracheobronchial tree is patent, no pneumothorax. RIGHT greater LEFT bronchial wall thickening. Dense hypoenhancing consolidation RIGHT lower lobe cord syrinx bonding to radiographic abnormality. Small RIGHT  pleural effusion. Mildly elevated RIGHT hemidiaphragm inferring a component of RIGHT lower lobe atelectasis. Mild heterogeneous lung attenuation compatible with small airway disease. UPPER ABDOMEN: Nonacute. Focal hepatic fatty infiltration about the falciform ligament. MUSCULOSKELETAL: Nonacute. Bilateral breast implants. Mild broad dextroscoliosis. Review of the MIP images confirms the above findings. IMPRESSION: 1. No acute pulmonary embolism. 2. RIGHT lower lobe bronchopneumonia with small RIGHT pleural effusion. Electronically Signed   By: Awilda Metro M.D.   On: 10/22/2017 14:34        Scheduled Meds: . azithromycin  500 mg Oral Q24H  . enoxaparin (LOVENOX) injection  40 mg Subcutaneous Q24H  . sodium chloride flush  3 mL Intravenous Q12H   Continuous Infusions: . sodium chloride    . sodium chloride 100 mL/hr at 10/23/17 0659  . cefTRIAXone (ROCEPHIN)  IV       LOS: 0 days     Jacquelin Hawking, MD [facility] 10/23/2017, 1:08 PM Pager: [phone]  If 7PM-7AM, please contact night-coverage [email or web address] 10/23/2017, 1:08 PM

## 2017-10-23 NOTE — Progress Notes (Signed)
PT Cancellation Note  Patient Details Name: Melissa Lucas MRN: [id number] DOB: 08/21/1965   Cancelled Treatment:    Reason Eval/Treat Not Completed: Other (comment).  Pt was just up to chair with nursing to get a bath and now is quite fatigued.  Asked to wait until later to get back up but did report she was able to walk with no assistance.   Ivar DrapeRuth E Braxen Dobek 10/23/2017, 10:31 AM   Samul Dadauth Matha Masse, PT MS Acute Rehab Dept. Number: [facility] [phone] and [facility] [phone]

## 2017-10-24 DIAGNOSIS — R740 Nonspecific elevation of levels of transaminase and lactic acid dehydrogenase [LDH]: Secondary | ICD-10-CM | POA: Diagnosis not present

## 2017-10-24 DIAGNOSIS — J181 Lobar pneumonia, unspecified organism: Secondary | ICD-10-CM | POA: Diagnosis not present

## 2017-10-24 LAB — URINE CULTURE

## 2017-10-24 MED ORDER — AZITHROMYCIN 250 MG PO TABS: 500.0000 mg | ORAL_TABLET | Freq: Every day | ORAL | 0 refills | Status: AC

## 2017-10-24 MED ORDER — CEFDINIR 300 MG PO CAPS: 300.0000 mg | ORAL_CAPSULE | Freq: Two times a day (BID) | ORAL | 0 refills | Status: AC

## 2017-10-24 NOTE — Discharge Instructions (Signed)
Community-Acquired Pneumonia, Adult Pneumonia is an infection of the lungs. One type of pneumonia can happen while a person is in a hospital. A different type can happen when a person is not in a hospital (community-acquired pneumonia). It is easy for this kind to spread from person to person. It can spread to you if you breathe near an infected person who coughs or sneezes. Some symptoms include:  A dry cough.  A wet (productive) cough.  Fever.  Sweating.  Chest pain.  Follow these instructions at home:  Take over-the-counter and prescription medicines only as told by your doctor. ? Only take cough medicine if you are losing sleep. ? If you were prescribed an antibiotic medicine, take it as told by your doctor. Do not stop taking the antibiotic even if you start to feel better.  Sleep with your head and neck raised (elevated). You can do this by putting a few pillows under your head, or you can sleep in a recliner.  Do not use tobacco products. These include cigarettes, chewing tobacco, and e-cigarettes. If you need help quitting, ask your doctor.  Drink enough water to keep your pee (urine) clear or pale yellow. A shot (vaccine) can help prevent pneumonia. Shots are often suggested for:  People older than 53 years of age.  People older than 53 years of age: ? Who are having cancer treatment. ? Who have Hach-term (chronic) lung disease. ? Who have problems with their body's defense system (immune system).  You may also prevent pneumonia if you take these actions:  Get the flu (influenza) shot every year.  Go to the dentist as often as told.  Wash your hands often. If soap and water are not available, use hand sanitizer.  Contact a doctor if:  You have a fever.  You lose sleep because your cough medicine does not help. Get help right away if:  You are short of breath and it gets worse.  You have more chest pain.  Your sickness gets worse. This is very serious  if: ? You are an older adult. ? Your body's defense system is weak.  You cough up blood. This information is not intended to replace advice given to you by your health care provider. Make sure you discuss any questions you have with your health care provider. Document Released: 08/21/2007 Document Revised: 08/10/2015 Document Reviewed: 06/29/2014 Elsevier Interactive Patient Education  2018 Elsevier Inc.  

## 2017-10-24 NOTE — Progress Notes (Signed)
PT Cancellation Note  Patient Details Name: Erskine Emeryeresa M Biswell MRN: [id number] DOB: 10/19/1965   Cancelled Treatment:    Reason Eval/Treat Not Completed: PT screened, no needs identified, will sign off   [facility]ILL[facility],[place] 10/24/2017, 10:51 AM

## 2017-10-24 NOTE — Discharge Summary (Signed)
Physician Discharge Summary  Melissa Lucas WUJ:[id number] DOB: [birth year] DOA: 10/22/2017  PCP: [facility]  Admit date: 10/22/2017 Discharge date: 10/24/2017  Admitted From: Home Disposition: Home  Recommendations for Outpatient Follow-up:  1. Follow up with PCP in 3 days 2. Recheck CMP to ensure liver enzyme elevation resolved s/p infection 3. Repeat chest x-ray in 2-3 weeks 4. Please follow up on the following pending results: Blood cultures  Home Health: None Equipment/Devices: None  Discharge Condition: Stable CODE STATUS: Full code Diet recommendation: Regular diet   Brief/Interim Summary:  Chief Complaint: Malaise  HPI: Melissa Lucas is a 52 y.o. female with medical history significant of anxiety.  Patient reports a five day history of worsening malaise and weakness.  She reports fevers during this course up to a T-max of 104 F.  She was using ibuprofen throughout the day with no significant improvement in symptoms.  She reports some mild nausea with associated vomiting.  No chest pain.  Over the weekend she did not have any coughing but has developed a little bit this morning.  ED Course: Vitals: T max of 103.3 degrees farenheit, pulse of 70-80s, respirations of 15, blood pressure of 90-100/60s, on room air at 90%+ oxygen saturation Labs: Sodium of 134, potassium of 3.2, AST/ALT of 53 and 49 respectively Imaging: CTA significant for right lower lobe pneumonia with associated pleural effusion Medications/Course: Ceftriaxon, azithromycin and IV fluids ordered. Blood cultures obtained.    Hospital course:  Right lower lobe pneumonia Empirically treated with ceftriaxone and azithromycin. Blood cultures obtained and have been no growth to date. Urine strep negative. Switched to Cefdinir and azithromycin for discharge.   Anxiety Continued xanax prn  Elevated AST/ALT In setting of acute illness. Stable. Outpatient  follow-up  Hypokalemia Potassium supplementation  Discharge Diagnoses:  Principal Problem:   Right lower lobe pneumonia (HCC) Active Problems:   Anxiety   Elevated AST (SGOT)   Elevated alanine aminotransferase (ALT) level   Hypokalemia    Discharge Instructions  Discharge Instructions    Call MD for:  difficulty breathing, headache or visual disturbances   Complete by:  As directed    Call MD for:  severe uncontrolled pain   Complete by:  As directed    Diet - low sodium heart healthy   Complete by:  As directed    Increase activity slowly   Complete by:  As directed      Allergies as of 10/24/2017   No Known Allergies     Medication List    TAKE these medications   ALPRAZolam 1 MG tablet Commonly known as:  XANAX Take 1 mg by mouth 2 (two) times daily as needed for anxiety.   amphetamine-dextroamphetamine 30 MG tablet Commonly known as:  ADDERALL Take 1 tablet by mouth daily as needed (anxiety).   azithromycin 250 MG tablet Commonly known as:  ZITHROMAX Take 2 tablets (500 mg total) by mouth at bedtime for 1 day.   cefdinir 300 MG capsule Commonly known as:  OMNICEF Take 1 capsule (300 mg total) by mouth 2 (two) times daily for 5 days.   zaleplon 10 MG capsule Commonly known as:  SONATA Take 10 mg by mouth at bedtime as needed for sleep.      Follow-up Information    [facility]. Schedule an appointment as soon as possible for a visit on 10/27/2017.   Specialty:  Family Medicine Contact information: [address]  [place] [address] [phone]          No Known Allergies  Consultations:  None   Procedures/Studies: Dg Chest 2 View  Result Date: 10/22/2017 CLINICAL DATA:  Chest pain, nausea, vomiting, shortness of breath and fever since Friday, possible carbon monoxide poisoning per EMS EXAM: CHEST - 2 VIEW COMPARISON:  04/20/2008 FINDINGS: Normal heart size, mediastinal contours, and pulmonary  vascularity. RIGHT lower lobe consolidation consistent with pneumonia. Remaining lungs clear. No pleural effusion or pneumothorax. Broad-based dextroconvex thoracic scoliosis. IMPRESSION: RIGHT lower lobe infiltrate consistent with pneumonia. Electronically Signed   By: Ulyses Southward M.D.   On: 10/22/2017 12:43   Ct Angio Chest Pe W And/or Wo Contrast  Result Date: 10/22/2017 CLINICAL DATA:  Dyspnea, angina for 5 days.  Positive D-dimer. EXAM: CT ANGIOGRAPHY CHEST WITH CONTRAST TECHNIQUE: Multidetector CT imaging of the chest was performed using the standard protocol during bolus administration of intravenous contrast. Multiplanar CT image reconstructions and MIPs were obtained to evaluate the vascular anatomy. CONTRAST:  ISOVUE-370 IOPAMIDOL (ISOVUE-370) INJECTION 76% COMPARISON:  Chest radiograph October 22, 2017 FINDINGS: CARDIOVASCULAR: Adequate contrast opacification of the pulmonary artery's. Main pulmonary artery is not enlarged. No pulmonary arterial filling defects to the level of the subsegmental branches. Heart size is normal, no right heart strain. Trace pericardial effusion. Thoracic aorta is normal course and caliber, unremarkable. MEDIASTINUM/NODES: No lymphadenopathy by CT size criteria. LUNGS/PLEURA: Tracheobronchial tree is patent, no pneumothorax. RIGHT greater LEFT bronchial wall thickening. Dense hypoenhancing consolidation RIGHT lower lobe cord syrinx bonding to radiographic abnormality. Small RIGHT pleural effusion. Mildly elevated RIGHT hemidiaphragm inferring a component of RIGHT lower lobe atelectasis. Mild heterogeneous lung attenuation compatible with small airway disease. UPPER ABDOMEN: Nonacute. Focal hepatic fatty infiltration about the falciform ligament. MUSCULOSKELETAL: Nonacute. Bilateral breast implants. Mild broad dextroscoliosis. Review of the MIP images confirms the above findings. IMPRESSION: 1. No acute pulmonary embolism. 2. RIGHT lower lobe bronchopneumonia with small  RIGHT pleural effusion. Electronically Signed   By: Awilda Metro M.D.   On: 10/22/2017 14:34      Subjective: Feeling better. Mild cough with some mild sputum production  Discharge Exam: Vitals:   10/24/17 0542 10/24/17 1020  BP: 104/64 102/73  Pulse: 78 79  Resp: 20 10  Temp: 98.8 F (37.1 C) 98 F (36.7 C)  SpO2: 90% 95%   Vitals:   10/23/17 1332 10/23/17 2212 10/24/17 0542 10/24/17 1020  BP: (!) 100/51 105/61 104/64 102/73  Pulse: 88 83 78 79  Resp: (!) 25 20 20 10   Temp: (!) 100.6 F (38.1 C) 98.8 F (37.1 C) 98.8 F (37.1 C) 98 F (36.7 C)  TempSrc: Oral Oral Oral Oral  SpO2: 93% 91% 90% 95%  Weight:      Height:        General: Pt is alert, awake, not in acute distress Cardiovascular: RRR, S1/S2 +, no rubs, no gallops Respiratory: Decreased breath sounds in right mid/lower lung levels with rales.  Abdominal: Soft, NT, ND, bowel sounds + Extremities: no edema, no cyanosis    The results of significant diagnostics from this hospitalization (including imaging, microbiology, ancillary and laboratory) are listed below for reference.     Microbiology: Recent Results (from the past 240 hour(s))  Blood culture (routine x 2)     Status: None (Preliminary result)   Collection Time: 10/22/17 11:15 AM  Result Value Ref Range Status   Specimen Description   Final    BLOOD LEFT HAND Performed at [facility], [address] [place], [place] [address]    Special Requests   Final    BOTTLES DRAWN AEROBIC AND ANAEROBIC Blood Culture adequate volume Performed at [facility], [address] [place], [place] [address]    Culture   Final    NO GROWTH 1 DAY Performed at [facility], [address] [place], [place] [address]    Report Status PENDING  Incomplete  Blood culture (routine x 2)     Status: None (Preliminary result)   Collection Time: 10/22/17 11:18 AM  Result Value Ref Range Status   Specimen  Description   Final    BLOOD RIGHT ANTECUBITAL Performed at [facility], [address] [place], [place] [address]    Special Requests   Final    BOTTLES DRAWN AEROBIC AND ANAEROBIC Blood Culture adequate volume Performed at [facility], [address] [place], [place] [address]    Culture   Final    NO GROWTH 1 DAY Performed at [facility], [address] [place], [place] [address]    Report Status PENDING  Incomplete  Urine culture     Status: Abnormal   Collection Time: 10/23/17  4:07 AM  Result Value Ref Range Status   Specimen Description   Final    URINE, CLEAN CATCH Performed at [facility], [address] [place], [place] [address]    Special Requests   Final    NONE Performed at [facility], [address] [place], [place] [address]    Culture (A)  Final    <10,000 COLONIES/mL INSIGNIFICANT GROWTH Performed at [facility], [address] [place], [place] [address]    Report Status 10/24/2017 FINAL  Final     Labs: BNP (last 3 results) No results for input(s): BNP in the last 8760 hours. Basic Metabolic Panel: Recent Labs  Lab 10/22/17 1117 10/23/17 0411  NA 134* 139  K 3.2* 3.4*  CL 95* 104  CO2 27 27  GLUCOSE 125* 110*  BUN 13 9  CREATININE 0.61 0.68  CALCIUM 9.0 8.5*   Liver Function Tests: Recent Labs  Lab 10/22/17 1117 10/23/17 0411  AST 53* 65*  ALT 49* 50*  ALKPHOS 79 69  BILITOT 0.6 0.5  PROT 6.9 6.5  ALBUMIN 2.9* 2.8*   Recent Labs  Lab 10/22/17 1117  LIPASE 21   No results for input(s): AMMONIA in the last 168 hours. CBC: Recent Labs  Lab 10/22/17 1117  WBC 10.2  NEUTROABS 8.4*  HGB 13.7  HCT 38.6  MCV 81.8  PLT 270   Cardiac Enzymes: No results for input(s): CKTOTAL, CKMB, CKMBINDEX, TROPONINI in the last 168 hours. BNP: Invalid input(s): POCBNP CBG: No results for input(s): GLUCAP in the last 168  hours. D-Dimer Recent Labs    10/22/17 1117  DDIMER 2.96*   Hgb A1c No results for input(s): HGBA1C in the last 72 hours. Lipid Profile No results for input(s): CHOL, HDL, LDLCALC, TRIG, CHOLHDL, LDLDIRECT in the last 72 hours. Thyroid function studies No results for input(s): TSH, T4TOTAL, T3FREE, THYROIDAB in the last 72 hours.  Invalid input(s): FREET3 Anemia work up No results for input(s): VITAMINB12, FOLATE, FERRITIN, TIBC, IRON, RETICCTPCT in the last 72 hours. Urinalysis    Component Value Date/Time   COLORURINE YELLOW 10/23/2017 0407   APPEARANCEUR CLEAR 10/23/2017 0407   LABSPEC 1.017 10/23/2017 0407   PHURINE 6.0 10/23/2017 0407  GLUCOSEU NEGATIVE 10/23/2017 0407   HGBUR NEGATIVE 10/23/2017 0407   BILIRUBINUR NEGATIVE 10/23/2017 0407   KETONESUR NEGATIVE 10/23/2017 0407   PROTEINUR NEGATIVE 10/23/2017 0407   NITRITE NEGATIVE 10/23/2017 0407   LEUKOCYTESUR NEGATIVE 10/23/2017 0407   Sepsis Labs Invalid input(s): PROCALCITONIN,  WBC,  LACTICIDVEN Microbiology Recent Results (from the past 240 hour(s))  Blood culture (routine x 2)     Status: None (Preliminary result)   Collection Time: 10/22/17 11:15 AM  Result Value Ref Range Status   Specimen Description   Final    BLOOD LEFT HAND Performed at [facility], [address] [place], [place] [address]    Special Requests   Final    BOTTLES DRAWN AEROBIC AND ANAEROBIC Blood Culture adequate volume Performed at [facility], [address], [place], [place] [address]    Culture   Final    NO GROWTH 1 DAY Performed at [facility], [address] [place], [place] [address]    Report Status PENDING  Incomplete  Blood culture (routine x 2)     Status: None (Preliminary result)   Collection Time: 10/22/17 11:18 AM  Result Value Ref Range Status   Specimen Description   Final    BLOOD RIGHT ANTECUBITAL Performed at [facility], [address] [place], [place] [address]    Special Requests   Final    BOTTLES DRAWN AEROBIC AND ANAEROBIC Blood Culture adequate volume Performed at [facility], [address] [place], [place] [address]    Culture   Final    NO GROWTH 1 DAY Performed at [facility], [address], [place], [place] [address]    Report Status PENDING  Incomplete  Urine culture     Status: Abnormal   Collection Time: 10/23/17  4:07 AM  Result Value Ref Range Status   Specimen Description   Final    URINE, CLEAN CATCH Performed at [facility], [address], [place], [place] [address]    Special Requests   Final    NONE Performed at [facility], [address], [place], [place] [address]    Culture (A)  Final    <10,000 COLONIES/mL INSIGNIFICANT GROWTH Performed at [facility], [address], [place], [place] [address]    Report Status 10/24/2017 FINAL  Final     SIGNED:   Jacquelin Hawking, MD [facility] 10/24/2017, 12:14 PM

## 2017-10-24 NOTE — Progress Notes (Signed)
Went over discharge papers with pt ,she verbalized understanding to all the instructions on d/c papers

## 2017-10-27 DIAGNOSIS — E349 Endocrine disorder, unspecified: Secondary | ICD-10-CM | POA: Diagnosis not present

## 2017-10-27 DIAGNOSIS — J181 Lobar pneumonia, unspecified organism: Secondary | ICD-10-CM | POA: Diagnosis not present

## 2017-10-27 LAB — CULTURE, BLOOD (ROUTINE X 2)
Culture: NO GROWTH
Culture: NO GROWTH
SPECIAL REQUESTS: ADEQUATE
Special Requests: ADEQUATE

## 2017-11-05 ENCOUNTER — Other Ambulatory Visit: Payer: Self-pay | Admitting: Family Medicine

## 2017-11-05 ENCOUNTER — Ambulatory Visit
Admission: RE | Admit: 2017-11-05 | Discharge: 2017-11-05 | Disposition: A | Discharge status: Home / Self Care | Payer: 59 | Source: Ambulatory Visit | Attending: Family Medicine | Admitting: Family Medicine

## 2017-11-05 DIAGNOSIS — J181 Lobar pneumonia, unspecified organism: Secondary | ICD-10-CM | POA: Diagnosis not present

## 2017-11-05 DIAGNOSIS — J189 Pneumonia, unspecified organism: Secondary | ICD-10-CM

## 2017-11-10 DIAGNOSIS — Z79899 Other long term (current) drug therapy: Secondary | ICD-10-CM | POA: Diagnosis not present

## 2018-03-02 DIAGNOSIS — H00022 Hordeolum internum right lower eyelid: Secondary | ICD-10-CM | POA: Diagnosis not present

## 2018-09-28 DIAGNOSIS — S92353A Displaced fracture of fifth metatarsal bone, unspecified foot, initial encounter for closed fracture: Secondary | ICD-10-CM | POA: Insufficient documentation

## 2019-04-14 DIAGNOSIS — S8263XA Displaced fracture of lateral malleolus of unspecified fibula, initial encounter for closed fracture: Secondary | ICD-10-CM | POA: Insufficient documentation

## 2019-08-31 IMAGING — CT CT ANGIO CHEST
2 of 6 series · 18 of 46 positions shown · IV contrast (ISOVUE)
Comparison: Chest radiograph October 22, 2017

CLINICAL DATA: Dyspnea, angina for 5 days.  Positive D-dimer.

EXAM:
CT ANGIOGRAPHY CHEST WITH CONTRAST
TECHNIQUE: Multidetector CT imaging of the chest was performed using the
standard protocol during bolus administration of intravenous
contrast. Multiplanar CT image reconstructions and MIPs were
obtained to evaluate the vascular anatomy.
CONTRAST:  100mL O2IXDN-629 IOPAMIDOL (O2IXDN-629) INJECTION 76%

[Series 5: thins · axial · 0.65mm/px · z∈[+1372,+1653]mm · 15 of 309 slices shown]
[im 14/309  lung]
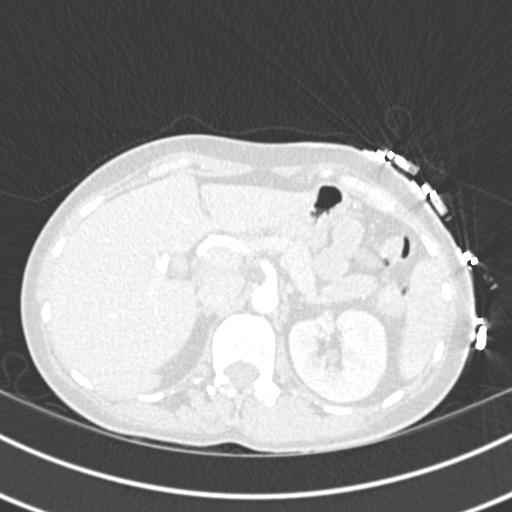
[im 41/309  soft-tissue]
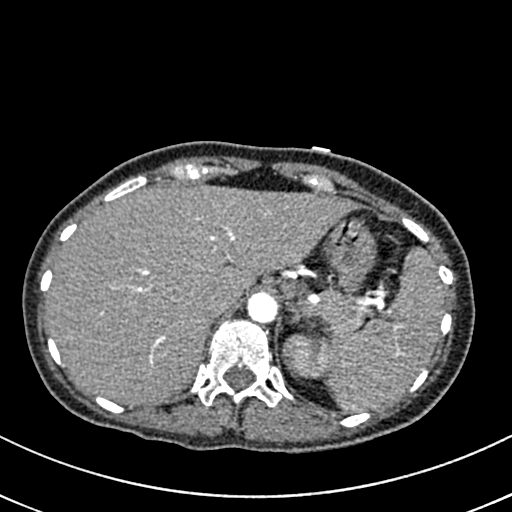
[im 54/309  lung]
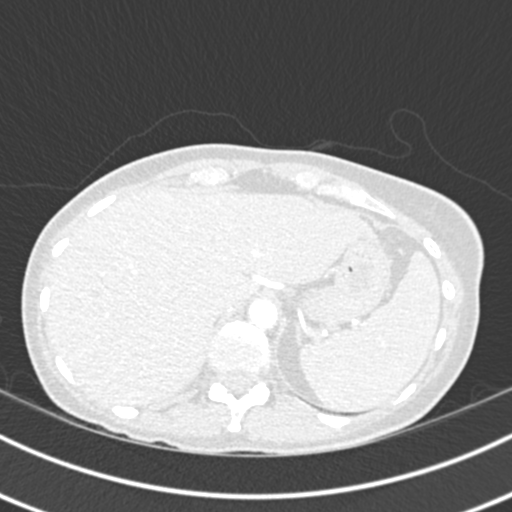
[im 81/309  soft-tissue]
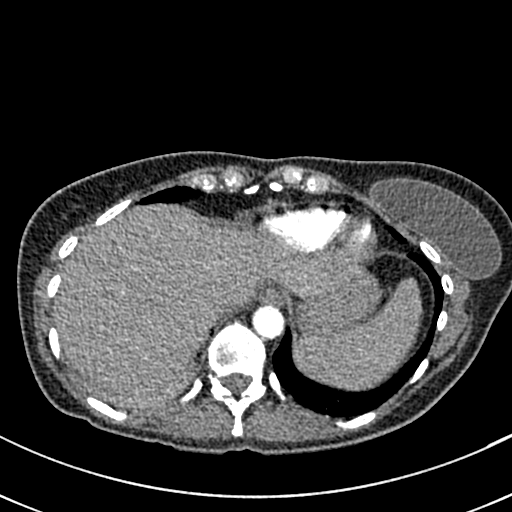
[im 94/309  lung]
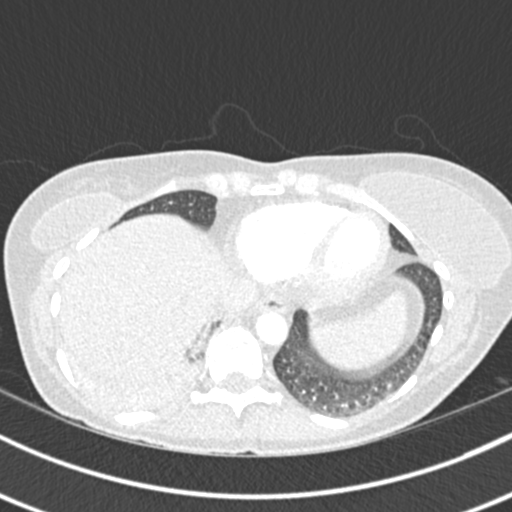
[im 121/309  soft-tissue]
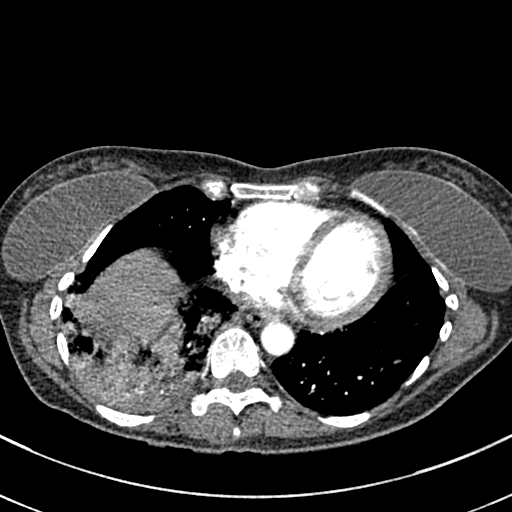
[im 134/309  lung]
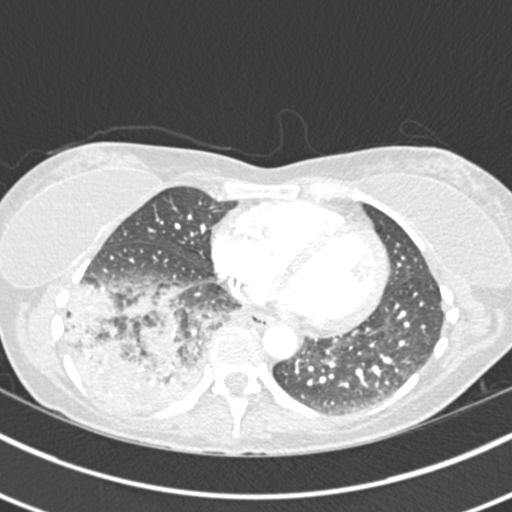
[im 161/309  soft-tissue]
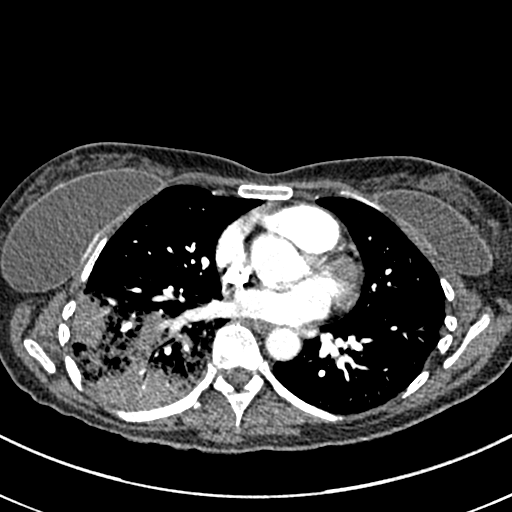
[im 175/309  lung]
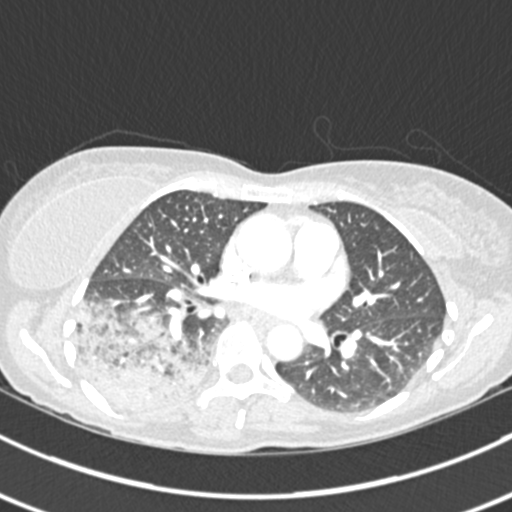
[im 188/309  soft-tissue]
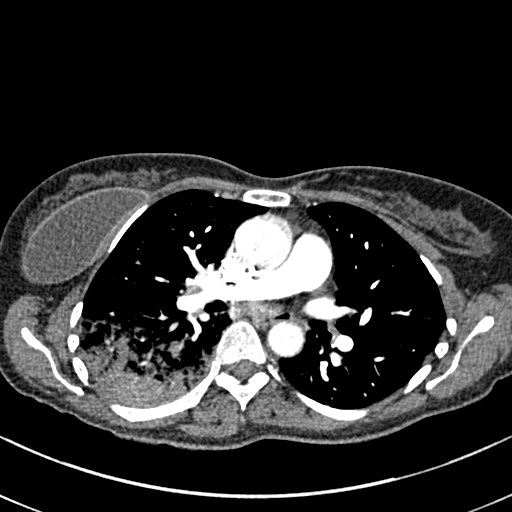
[im 215/309  lung]
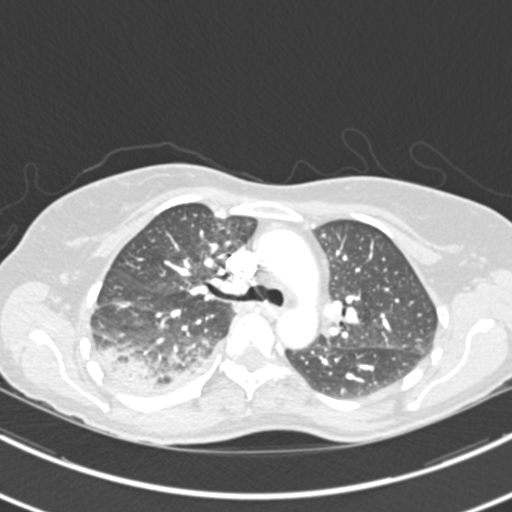
[im 228/309  soft-tissue]
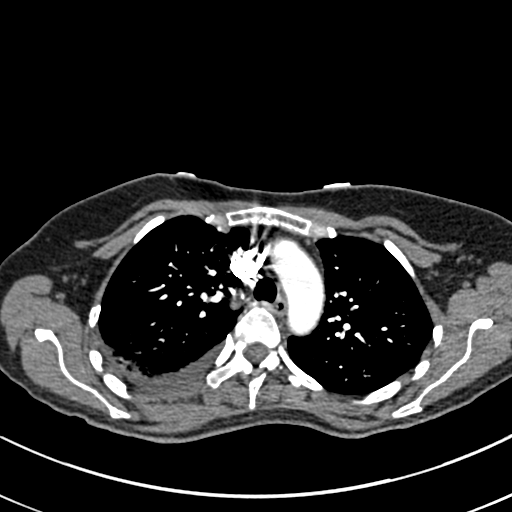
[im 255/309  lung]
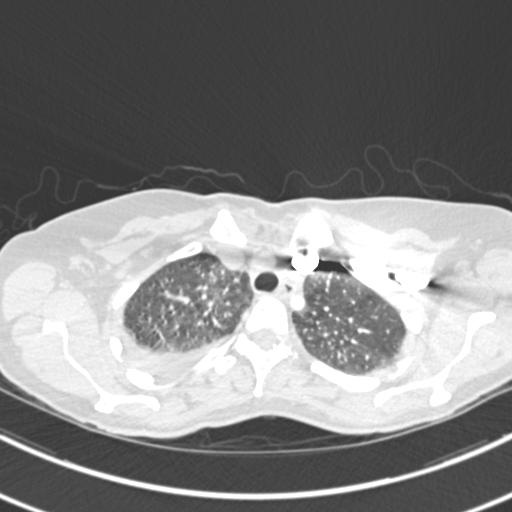
[im 268/309  soft-tissue]
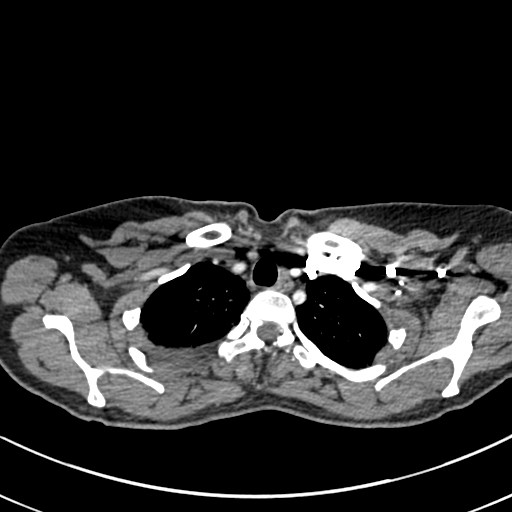
[im 295/309  lung]
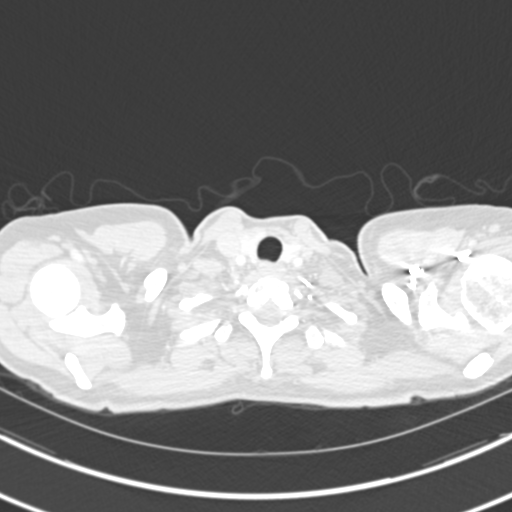

[Series 7: coronal mpr · coronal · 0.59mm/px · 3 of 110 slices shown]
[im 28/110  soft-tissue]
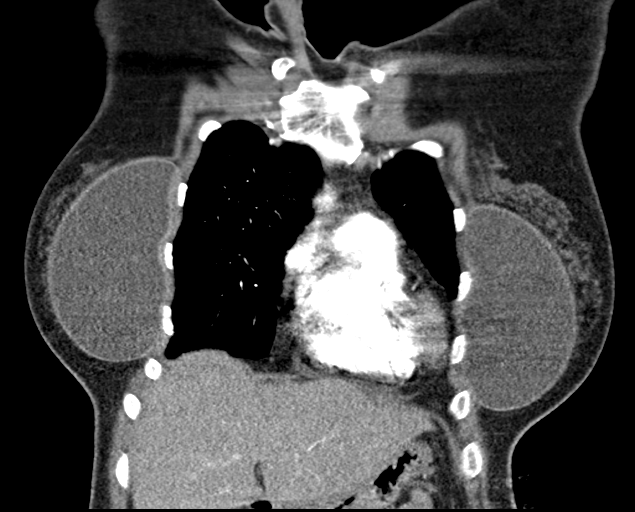
[im 55/110  soft-tissue]
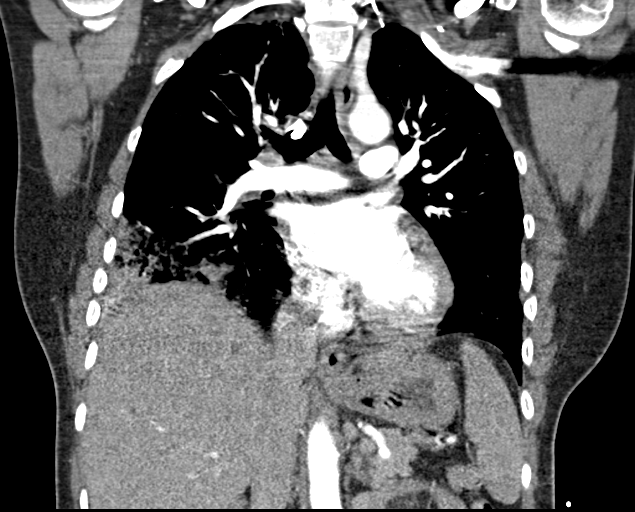
[im 82/110  soft-tissue]
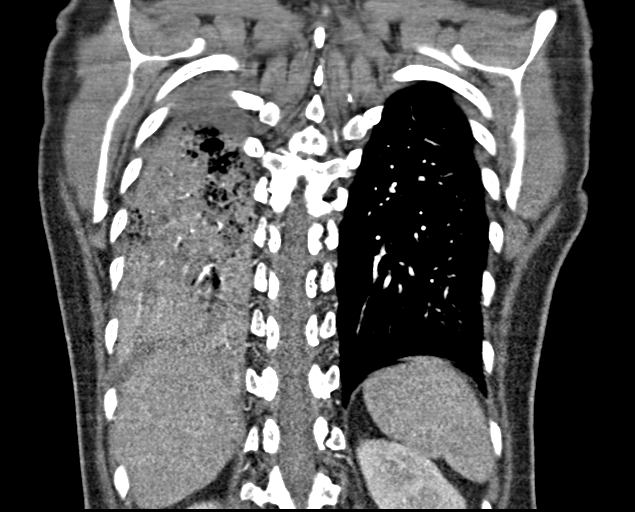

[18 of 46 positions shown; findings below may reference images not displayed]

FINDINGS: CARDIOVASCULAR: Adequate contrast opacification of the pulmonary
artery's. Main pulmonary artery is not enlarged. No pulmonary
arterial filling defects to the level of the subsegmental branches.
Heart size is normal, no right heart strain. Trace pericardial
effusion. Thoracic aorta is normal course and caliber, unremarkable.

MEDIASTINUM/NODES: No lymphadenopathy by CT size criteria.

LUNGS/PLEURA: Tracheobronchial tree is patent, no pneumothorax.
RIGHT greater LEFT bronchial wall thickening. Dense hypoenhancing
consolidation RIGHT lower lobe cord syrinx bonding to radiographic
abnormality. Small RIGHT pleural effusion. Mildly elevated RIGHT
hemidiaphragm inferring a component of RIGHT lower lobe atelectasis.
Mild heterogeneous lung attenuation compatible with small airway
disease.

UPPER ABDOMEN: Nonacute. Focal hepatic fatty infiltration about the
falciform ligament.

MUSCULOSKELETAL: Nonacute. Bilateral breast implants. Mild broad
dextroscoliosis.

Review of the MIP images confirms the above findings.
IMPRESSION: 1. No acute pulmonary embolism.
2. RIGHT lower lobe bronchopneumonia with small RIGHT pleural
effusion.

## 2020-03-28 ENCOUNTER — Other Ambulatory Visit: Payer: 59

## 2020-05-31 ENCOUNTER — Encounter (HOSPITAL_BASED_OUTPATIENT_CLINIC_OR_DEPARTMENT_OTHER): Payer: Self-pay | Admitting: Emergency Medicine

## 2020-05-31 ENCOUNTER — Emergency Department (HOSPITAL_BASED_OUTPATIENT_CLINIC_OR_DEPARTMENT_OTHER): Payer: BC Managed Care – PPO

## 2020-05-31 ENCOUNTER — Emergency Department (HOSPITAL_COMMUNITY): Payer: BC Managed Care – PPO | Admitting: Certified Registered Nurse Anesthetist

## 2020-05-31 ENCOUNTER — Observation Stay (HOSPITAL_BASED_OUTPATIENT_CLINIC_OR_DEPARTMENT_OTHER)
Admission: EM | Admit: 2020-05-31 | Discharge: 2020-06-01 | Disposition: A | Discharge status: Home / Self Care | Payer: BC Managed Care – PPO | Attending: General Surgery | Admitting: General Surgery

## 2020-05-31 ENCOUNTER — Encounter (HOSPITAL_COMMUNITY)
Admission: EM | Disposition: A | Discharge status: Home / Self Care | Payer: Self-pay | Source: Home / Self Care | Attending: Emergency Medicine

## 2020-05-31 ENCOUNTER — Other Ambulatory Visit: Payer: Self-pay

## 2020-05-31 DIAGNOSIS — Z20822 Contact with and (suspected) exposure to covid-19: Secondary | ICD-10-CM | POA: Diagnosis not present

## 2020-05-31 DIAGNOSIS — K37 Unspecified appendicitis: Secondary | ICD-10-CM | POA: Diagnosis present

## 2020-05-31 DIAGNOSIS — K3580 Unspecified acute appendicitis: Principal | ICD-10-CM | POA: Insufficient documentation

## 2020-05-31 DIAGNOSIS — Z9049 Acquired absence of other specified parts of digestive tract: Secondary | ICD-10-CM

## 2020-05-31 DIAGNOSIS — R1031 Right lower quadrant pain: Secondary | ICD-10-CM | POA: Diagnosis present

## 2020-05-31 HISTORY — PX: LAPAROSCOPIC APPENDECTOMY: SHX408

## 2020-05-31 LAB — PREGNANCY, URINE: Preg Test, Ur: NEGATIVE

## 2020-05-31 LAB — CBC WITH DIFFERENTIAL/PLATELET
Abs Immature Granulocytes: 0.04 10*3/uL (ref 0.00–0.07)
Basophils Absolute: 0.1 10*3/uL (ref 0.0–0.1)
Basophils Relative: 0 %
Eosinophils Absolute: 0.1 10*3/uL (ref 0.0–0.5)
Eosinophils Relative: 1 %
HCT: 38.7 % (ref 36.0–46.0)
Hemoglobin: 13.1 g/dL (ref 12.0–15.0)
Immature Granulocytes: 0 %
Lymphocytes Relative: 16 %
Lymphs Abs: 1.9 10*3/uL (ref 0.7–4.0)
MCH: 28.7 pg (ref 26.0–34.0)
MCHC: 33.9 g/dL (ref 30.0–36.0)
MCV: 84.7 fL (ref 80.0–100.0)
Monocytes Absolute: 0.9 10*3/uL (ref 0.1–1.0)
Monocytes Relative: 8 %
Neutro Abs: 9.2 10*3/uL — ABNORMAL HIGH (ref 1.7–7.7)
Neutrophils Relative %: 75 %
Platelets: 228 10*3/uL (ref 150–400)
RBC: 4.57 MIL/uL (ref 3.87–5.11)
RDW: 13.3 % (ref 11.5–15.5)
WBC: 12.2 10*3/uL — ABNORMAL HIGH (ref 4.0–10.5)
nRBC: 0 % (ref 0.0–0.2)

## 2020-05-31 LAB — COMPREHENSIVE METABOLIC PANEL
ALT: 17 U/L (ref 0–44)
AST: 19 U/L (ref 15–41)
Albumin: 4.2 g/dL (ref 3.5–5.0)
Alkaline Phosphatase: 46 U/L (ref 38–126)
Anion gap: 8 (ref 5–15)
BUN: 11 mg/dL (ref 6–20)
CO2: 25 mmol/L (ref 22–32)
Calcium: 9 mg/dL (ref 8.9–10.3)
Chloride: 103 mmol/L (ref 98–111)
Creatinine, Ser: 0.57 mg/dL (ref 0.44–1.00)
GFR, Estimated: >60 mL/min (ref >60)
Glucose, Bld: 108 mg/dL — ABNORMAL HIGH (ref 70–99)
Potassium: 3.8 mmol/L (ref 3.5–5.1)
Sodium: 136 mmol/L (ref 135–145)
Total Bilirubin: 0.4 mg/dL (ref 0.3–1.2)
Total Protein: 6.3 g/dL — ABNORMAL LOW (ref 6.5–8.1)

## 2020-05-31 LAB — URINALYSIS, ROUTINE W REFLEX MICROSCOPIC
Bilirubin Urine: NEGATIVE
Glucose, UA: NEGATIVE mg/dL
Hgb urine dipstick: NEGATIVE
Ketones, ur: NEGATIVE mg/dL
Leukocytes,Ua: NEGATIVE
Nitrite: NEGATIVE
Protein, ur: NEGATIVE mg/dL
Specific Gravity, Urine: 1.004 — ABNORMAL LOW (ref 1.005–1.030)
pH: 6 (ref 5.0–8.0)

## 2020-05-31 LAB — RESP PANEL BY RT-PCR (FLU A&B, COVID) ARPGX2
Influenza A by PCR: NEGATIVE
Influenza B by PCR: NEGATIVE
SARS Coronavirus 2 by RT PCR: NEGATIVE

## 2020-05-31 LAB — LIPASE, BLOOD: Lipase: 24 U/L (ref 11–51)

## 2020-05-31 SURGERY — APPENDECTOMY, LAPAROSCOPIC
Anesthesia: General | Site: Abdomen

## 2020-05-31 MED ORDER — MIDAZOLAM HCL 2 MG/2ML IJ SOLN: 0.5000 mg | Freq: Once | INTRAMUSCULAR | Status: DC | PRN

## 2020-05-31 MED ORDER — IBUPROFEN 600 MG PO TABS
600.0000 mg | ORAL_TABLET | Freq: Four times a day (QID) | ORAL | Status: DC | PRN
  Administered 2020-06-01 (×2): 600 mg via ORAL
  Filled 2020-05-31 (×2): qty 1

## 2020-05-31 MED ORDER — SODIUM CHLORIDE 0.9 % IV BOLUS
1000.0000 mL | Freq: Once | INTRAVENOUS | Status: AC
  Administered 2020-05-31: 1000 mL via INTRAVENOUS

## 2020-05-31 MED ORDER — OXYCODONE HCL 5 MG/5ML PO SOLN
5.0000 mg | Freq: Once | ORAL | Status: DC | PRN
Start: 2020-05-31 — End: 2020-05-31

## 2020-05-31 MED ORDER — METRONIDAZOLE IN NACL 5-0.79 MG/ML-% IV SOLN
500.0000 mg | Freq: Once | INTRAVENOUS | Status: AC
  Administered 2020-05-31: 500 mg via INTRAVENOUS
  Filled 2020-05-31: qty 100

## 2020-05-31 MED ORDER — EPHEDRINE SULFATE-NACL 50-0.9 MG/10ML-% IV SOSY
PREFILLED_SYRINGE | INTRAVENOUS | Status: DC | PRN
  Administered 2020-05-31: 10 mg via INTRAVENOUS
  Administered 2020-05-31: 15 mg via INTRAVENOUS
  Administered 2020-05-31: 5 mg via INTRAVENOUS

## 2020-05-31 MED ORDER — SODIUM CHLORIDE 0.9 % IR SOLN
Status: DC | PRN
  Administered 2020-05-31: 1000 mL

## 2020-05-31 MED ORDER — MIDAZOLAM HCL 2 MG/2ML IJ SOLN
INTRAMUSCULAR | Status: AC
  Filled 2020-05-31: qty 2

## 2020-05-31 MED ORDER — ACETAMINOPHEN 500 MG PO TABS
1000.0000 mg | ORAL_TABLET | Freq: Four times a day (QID) | ORAL | Status: DC
  Administered 2020-06-01 (×3): 1000 mg via ORAL
  Filled 2020-05-31 (×3): qty 2

## 2020-05-31 MED ORDER — LIDOCAINE 2% (20 MG/ML) 5 ML SYRINGE
INTRAMUSCULAR | Status: DC | PRN
  Administered 2020-05-31: 20 mg via INTRAVENOUS

## 2020-05-31 MED ORDER — SODIUM CHLORIDE 0.9 % IV SOLN
INTRAVENOUS | Status: DC | PRN
  Administered 2020-05-31: 250 mL via INTRAVENOUS

## 2020-05-31 MED ORDER — FENTANYL CITRATE (PF) 250 MCG/5ML IJ SOLN
INTRAMUSCULAR | Status: DC | PRN
  Administered 2020-05-31: 100 ug via INTRAVENOUS
  Administered 2020-05-31: 50 ug via INTRAVENOUS

## 2020-05-31 MED ORDER — SUGAMMADEX SODIUM 200 MG/2ML IV SOLN
INTRAVENOUS | Status: DC | PRN
  Administered 2020-05-31: 200 mg via INTRAVENOUS

## 2020-05-31 MED ORDER — FENTANYL CITRATE (PF) 250 MCG/5ML IJ SOLN
INTRAMUSCULAR | Status: AC
  Filled 2020-05-31: qty 5

## 2020-05-31 MED ORDER — HYDROMORPHONE HCL 1 MG/ML IJ SOLN
INTRAMUSCULAR | Status: AC
  Filled 2020-05-31: qty 1

## 2020-05-31 MED ORDER — SUCCINYLCHOLINE CHLORIDE 200 MG/10ML IV SOSY
PREFILLED_SYRINGE | INTRAVENOUS | Status: DC | PRN
  Administered 2020-05-31: 160 mg via INTRAVENOUS

## 2020-05-31 MED ORDER — ROCURONIUM BROMIDE 10 MG/ML (PF) SYRINGE
PREFILLED_SYRINGE | INTRAVENOUS | Status: DC | PRN
  Administered 2020-05-31: 50 mg via INTRAVENOUS

## 2020-05-31 MED ORDER — CHLORHEXIDINE GLUCONATE 0.12 % MT SOLN
OROMUCOSAL | Status: AC
  Filled 2020-05-31: qty 15

## 2020-05-31 MED ORDER — PHENYLEPHRINE 40 MCG/ML (10ML) SYRINGE FOR IV PUSH (FOR BLOOD PRESSURE SUPPORT)
PREFILLED_SYRINGE | INTRAVENOUS | Status: DC | PRN
  Administered 2020-05-31 (×2): 80 ug via INTRAVENOUS
  Administered 2020-05-31: 120 ug via INTRAVENOUS

## 2020-05-31 MED ORDER — MEPERIDINE HCL 25 MG/ML IJ SOLN
6.2500 mg | INTRAMUSCULAR | Status: DC | PRN
Start: 2020-05-31 — End: 2020-05-31

## 2020-05-31 MED ORDER — OXYCODONE HCL 5 MG PO TABS: 5.0000 mg | ORAL_TABLET | Freq: Once | ORAL | Status: DC | PRN

## 2020-05-31 MED ORDER — CHLORHEXIDINE GLUCONATE 0.12 % MT SOLN
15.0000 mL | OROMUCOSAL | Status: AC
  Administered 2020-05-31: 15 mL via OROMUCOSAL
  Filled 2020-05-31: qty 15

## 2020-05-31 MED ORDER — FENTANYL CITRATE (PF) 100 MCG/2ML IJ SOLN
50.0000 ug | Freq: Once | INTRAMUSCULAR | Status: AC
  Administered 2020-05-31: 50 ug via INTRAVENOUS
  Filled 2020-05-31: qty 2

## 2020-05-31 MED ORDER — IOHEXOL 300 MG/ML  SOLN
80.0000 mL | Freq: Once | INTRAMUSCULAR | Status: AC | PRN
  Administered 2020-05-31: 80 mL via INTRAVENOUS

## 2020-05-31 MED ORDER — PROPOFOL 10 MG/ML IV BOLUS
INTRAVENOUS | Status: AC
  Filled 2020-05-31: qty 20

## 2020-05-31 MED ORDER — SCOPOLAMINE 1 MG/3DAYS TD PT72
1.0000 | MEDICATED_PATCH | TRANSDERMAL | Status: DC
  Administered 2020-05-31: 1.5 mg via TRANSDERMAL

## 2020-05-31 MED ORDER — LACTATED RINGERS IV SOLN: INTRAVENOUS | Status: DC

## 2020-05-31 MED ORDER — MIDAZOLAM HCL 2 MG/2ML IJ SOLN
INTRAMUSCULAR | Status: DC | PRN
  Administered 2020-05-31: 2 mg via INTRAVENOUS

## 2020-05-31 MED ORDER — PROMETHAZINE HCL 25 MG/ML IJ SOLN: 6.2500 mg | INTRAMUSCULAR | Status: DC | PRN

## 2020-05-31 MED ORDER — TRAMADOL HCL 50 MG PO TABS
50.0000 mg | ORAL_TABLET | Freq: Four times a day (QID) | ORAL | Status: DC | PRN
  Administered 2020-05-31 – 2020-06-01 (×2): 50 mg via ORAL
  Filled 2020-05-31 (×2): qty 1

## 2020-05-31 MED ORDER — ALBUMIN HUMAN 5 % IV SOLN: INTRAVENOUS | Status: DC | PRN

## 2020-05-31 MED ORDER — BUPIVACAINE-EPINEPHRINE 0.25% -1:200000 IJ SOLN
INTRAMUSCULAR | Status: DC | PRN
  Administered 2020-05-31: 7 mL
  Administered 2020-05-31: 8 mL

## 2020-05-31 MED ORDER — CEFTRIAXONE SODIUM 2 G IJ SOLR
2.0000 g | Freq: Once | INTRAMUSCULAR | Status: AC
  Administered 2020-05-31: 2 g via INTRAVENOUS
  Filled 2020-05-31: qty 20

## 2020-05-31 MED ORDER — IOHEXOL 9 MG/ML PO SOLN
1000.0000 mL | Freq: Once | ORAL | Status: AC
  Administered 2020-05-31: 1000 mL via ORAL

## 2020-05-31 MED ORDER — 0.9 % SODIUM CHLORIDE (POUR BTL) OPTIME
TOPICAL | Status: DC | PRN
  Administered 2020-05-31: 1000 mL

## 2020-05-31 MED ORDER — DEXAMETHASONE SODIUM PHOSPHATE 10 MG/ML IJ SOLN
INTRAMUSCULAR | Status: DC | PRN
  Administered 2020-05-31: 5 mg via INTRAVENOUS

## 2020-05-31 MED ORDER — ONDANSETRON HCL 4 MG/2ML IJ SOLN
INTRAMUSCULAR | Status: DC | PRN
  Administered 2020-05-31: 4 mg via INTRAVENOUS

## 2020-05-31 MED ORDER — SCOPOLAMINE 1 MG/3DAYS TD PT72
MEDICATED_PATCH | TRANSDERMAL | Status: AC
  Filled 2020-05-31: qty 1

## 2020-05-31 MED ORDER — PHENYLEPHRINE HCL-NACL 10-0.9 MG/250ML-% IV SOLN: INTRAVENOUS | Status: DC | PRN

## 2020-05-31 MED ORDER — BUPIVACAINE-EPINEPHRINE (PF) 0.25% -1:200000 IJ SOLN
INTRAMUSCULAR | Status: AC
  Filled 2020-05-31: qty 30

## 2020-05-31 MED ORDER — HYDROMORPHONE HCL 1 MG/ML IJ SOLN
0.2500 mg | INTRAMUSCULAR | Status: DC | PRN
  Administered 2020-05-31 (×4): 0.5 mg via INTRAVENOUS

## 2020-05-31 MED ORDER — PROPOFOL 10 MG/ML IV BOLUS
INTRAVENOUS | Status: DC | PRN
  Administered 2020-05-31: 200 mg via INTRAVENOUS

## 2020-05-31 SURGICAL SUPPLY — 34 items
ADH SKN CLS APL DERMABOND .7 (GAUZE/BANDAGES/DRESSINGS) ×1
APL PRP STRL LF DISP 70% ISPRP (MISCELLANEOUS) ×1
BAG SPEC RTRVL LRG 6X4 10 (ENDOMECHANICALS) ×1
CANISTER SUCT 3000ML PPV (MISCELLANEOUS) ×2 IMPLANT
CHLORAPREP W/TINT 26 (MISCELLANEOUS) ×2 IMPLANT
COVER SURGICAL LIGHT HANDLE (MISCELLANEOUS) ×2 IMPLANT
DERMABOND ADVANCED (GAUZE/BANDAGES/DRESSINGS) ×1
DERMABOND ADVANCED .7 DNX12 (GAUZE/BANDAGES/DRESSINGS) ×1 IMPLANT
ELECT REM PT RETURN 9FT ADLT (ELECTROSURGICAL) ×2
ELECTRODE REM PT RTRN 9FT ADLT (ELECTROSURGICAL) ×1 IMPLANT
GLOVE BIO SURGEON STRL SZ7.5 (GLOVE) ×2 IMPLANT
GLOVE INDICATOR 8.0 STRL GRN (GLOVE) ×2 IMPLANT
GOWN STRL REUS W/ TWL LRG LVL3 (GOWN DISPOSABLE) ×2 IMPLANT
GOWN STRL REUS W/ TWL XL LVL3 (GOWN DISPOSABLE) ×1 IMPLANT
GOWN STRL REUS W/TWL LRG LVL3 (GOWN DISPOSABLE) ×4
GOWN STRL REUS W/TWL XL LVL3 (GOWN DISPOSABLE) ×2
KIT BASIN OR (CUSTOM PROCEDURE TRAY) ×2 IMPLANT
KIT TURNOVER KIT B (KITS) ×2 IMPLANT
NS IRRIG 1000ML POUR BTL (IV SOLUTION) ×2 IMPLANT
PAD ARMBOARD 7.5X6 YLW CONV (MISCELLANEOUS) ×4 IMPLANT
PENCIL SMOKE EVACUATOR (MISCELLANEOUS) ×3 IMPLANT
POUCH SPECIMEN RETRIEVAL 10MM (ENDOMECHANICALS) ×2 IMPLANT
SET IRRIG TUBING LAPAROSCOPIC (IRRIGATION / IRRIGATOR) ×2 IMPLANT
SET TUBE SMOKE EVAC HIGH FLOW (TUBING) ×2 IMPLANT
SLEEVE ENDOPATH XCEL 5M (ENDOMECHANICALS) ×2 IMPLANT
SPECIMEN JAR SMALL (MISCELLANEOUS) ×2 IMPLANT
SUT MNCRL AB 4-0 PS2 18 (SUTURE) ×2 IMPLANT
TOWEL GREEN STERILE (TOWEL DISPOSABLE) ×2 IMPLANT
TOWEL GREEN STERILE FF (TOWEL DISPOSABLE) ×2 IMPLANT
TRAY FOLEY W/BAG SLVR 16FR (SET/KITS/TRAYS/PACK) ×2
TRAY FOLEY W/BAG SLVR 16FR ST (SET/KITS/TRAYS/PACK) ×1 IMPLANT
TRAY LAPAROSCOPIC MC (CUSTOM PROCEDURE TRAY) ×2 IMPLANT
TROCAR XCEL BLUNT TIP 100MML (ENDOMECHANICALS) ×2 IMPLANT
TROCAR XCEL NON-BLD 5MMX100MML (ENDOMECHANICALS) ×2 IMPLANT

## 2020-05-31 NOTE — ED Notes (Signed)
Surgeon at bedside.  

## 2020-05-31 NOTE — ED Notes (Signed)
PT does not want pain meds at this time.

## 2020-05-31 NOTE — Anesthesia Procedure Notes (Signed)
Procedure Name: Intubation Date/Time: 05/31/2020 6:47 PM Performed by: Reece Agar, CRNA Pre-anesthesia Checklist: Patient identified, Emergency Drugs available, Suction available and Patient being monitored Patient Re-evaluated:Patient Re-evaluated prior to induction Oxygen Delivery Method: Circle System Utilized Preoxygenation: Pre-oxygenation with 100% oxygen Induction Type: IV induction Ventilation: Mask ventilation without difficulty Laryngoscope Size: Mac and 3 Grade View: Grade I Tube type: Oral Tube size: 7.0 mm Number of attempts: 1 Airway Equipment and Method: Stylet Placement Confirmation: ETT inserted through vocal cords under direct vision,  positive ETCO2 and breath sounds checked- equal and bilateral Secured at: 22 cm Tube secured with: Tape Dental Injury: Injury to lip  Comments: Small lip laceration, cleansed and lubrication applied

## 2020-05-31 NOTE — Transfer of Care (Signed)
Immediate Anesthesia Transfer of Care Note  Patient: Melissa Lucas  Procedure(s) Performed: APPENDECTOMY LAPAROSCOPIC (N/A Abdomen)  Patient Location: PACU  Anesthesia Type:General  Level of Consciousness: drowsy  Airway & Oxygen Therapy: Patient Spontanous Breathing and Patient connected to nasal cannula oxygen  Post-op Assessment: Report given to RN, Post -op Vital signs reviewed and stable and Patient moving all extremities  Post vital signs: Reviewed and stable  Last Vitals:  Vitals Value Taken Time  BP 134/78 05/31/20 2001  Temp    Pulse 84 05/31/20 2009  Resp 22 05/31/20 2009  SpO2 94 % 05/31/20 2009  Vitals shown include unvalidated device data.  Last Pain:  Vitals:   05/31/20 1805  TempSrc: Oral  PainSc:          Complications: No complications documented.

## 2020-05-31 NOTE — ED Provider Notes (Signed)
[facility] EMERGENCY DEPT Provider Note   CSN: [id number] Arrival date & time: 05/31/20  1123     History Chief Complaint  Patient presents with  . Abdominal Pain    Melissa Lucas is a 55 y.o. female.  She has no significant past medical history.  She has had some right-sided low back pain that is been going on for over a week.  She has had this before.  She is also had some bloating feeling and constipation and has been trying some apple juice and bran muffins.  Today noticed more intense stabbing right lower quadrant abdominal pain.  Rates it as 5 out of 10.  Worse with palpation.  No nausea vomiting fevers chills.  No urinary symptoms.  No vaginal bleeding or discharge.  She is postmenopausal.  She has had some urinary frequency but no dysuria.  The history is provided by the patient.  Abdominal Pain Pain location:  RLQ Pain quality: stabbing   Pain radiates to:  Back Pain severity:  Moderate Onset quality:  Gradual Timing:  Constant Progression:  Unchanged Chronicity:  New Context: not previous surgeries and not trauma   Relieved by:  None tried Worsened by:  Palpation Ineffective treatments:  NSAIDs Associated symptoms: constipation   Associated symptoms: no chest pain, no chills, no cough, no diarrhea, no dysuria, no fever, no hematemesis, no hematochezia, no hematuria, no shortness of breath, no sore throat, no vaginal bleeding, no vaginal discharge and no vomiting        Past Medical History:  Diagnosis Date  . Anxiety     Patient Active Problem List   Diagnosis Date Noted  . Right lower lobe pneumonia 10/22/2017  . Anxiety 10/22/2017  . Elevated AST (SGOT) 10/22/2017  . Elevated alanine aminotransferase (ALT) level 10/22/2017  . Hypokalemia 10/22/2017    Past Surgical History:  Procedure Laterality Date  . FOREARM FRACTURE SURGERY       OB History    Gravida  1   Para  1   Term  1   Preterm      AB      Living  1     SAB       IAB      Ectopic      Multiple      Live Births              Family History  Problem Relation Age of Onset  . Diabetes Father   . CAD Father   . Heart disease Father   . Heart attack Maternal Grandfather   . Stroke Maternal Grandfather   . Lung disease Neg Hx     Social History   Tobacco Use  . Smoking status: Never Smoker  . Smokeless tobacco: Never Used  Substance Use Topics  . Alcohol use: Yes    Comment: occasional drink  . Drug use: Never    Home Medications Prior to Admission medications   Medication Sig Start Date End Date Taking? Authorizing Provider  ALPRAZolam Prudy Feeler) 1 MG tablet Take 1 mg by mouth 2 (two) times daily as needed for anxiety.  10/01/17  Yes [provider]  amphetamine-dextroamphetamine (ADDERALL) 30 MG tablet Take 1 tablet by mouth daily as needed (anxiety).  10/03/17  Yes [provider]  zaleplon (SONATA) 10 MG capsule Take 10 mg by mouth at bedtime as needed for sleep.  09/09/17   [provider]    Allergies    Patient has no known allergies.  Review of Systems   Review of Systems  Constitutional: Negative for chills and fever.  HENT: Negative for sore throat.   Eyes: Negative for visual disturbance.  Respiratory: Negative for cough and shortness of breath.   Cardiovascular: Negative for chest pain.  Gastrointestinal: Positive for abdominal pain and constipation. Negative for diarrhea, hematemesis, hematochezia and vomiting.  Genitourinary: Negative for dysuria, hematuria, vaginal bleeding and vaginal discharge.  Musculoskeletal: Positive for back pain.  Skin: Negative for rash.  Neurological: Negative for headaches.    Physical Exam Updated Vital Signs BP (!) 152/77 (BP Location: Right Arm)   Pulse 92   Temp 98.5 F (36.9 C) (Oral)   Resp 18   Ht 5\' 7"  (1.702 m)   Wt 65.8 kg   SpO2 98%   BMI 22.71 kg/m   Physical Exam Vitals and nursing note reviewed.  Constitutional:      General: She  is not in acute distress.    Appearance: Normal appearance. She is well-developed.  HENT:     Head: Normocephalic and atraumatic.  Eyes:     Conjunctiva/sclera: Conjunctivae normal.  Cardiovascular:     Rate and Rhythm: Normal rate and regular rhythm.     Pulses: Normal pulses.     Heart sounds: No murmur heard.   Pulmonary:     Effort: Pulmonary effort is normal. No respiratory distress.     Breath sounds: Normal breath sounds.  Abdominal:     Palpations: Abdomen is soft.     Tenderness: There is abdominal tenderness (rlq). There is no guarding or rebound.     Hernia: No hernia is present.  Musculoskeletal:        General: No deformity or signs of injury. Normal range of motion.     Cervical back: Neck supple.  Skin:    General: Skin is warm and dry.  Neurological:     General: No focal deficit present.     Mental Status: She is alert.     ED Results / Procedures / Treatments   Labs (all labs ordered are listed, but only abnormal results are displayed) Labs Reviewed  CBC WITH DIFFERENTIAL/PLATELET - Abnormal; Notable for the following components:      Result Value   WBC 12.2 (*)    Neutro Abs 9.2 (*)    All other components within normal limits  COMPREHENSIVE METABOLIC PANEL - Abnormal; Notable for the following components:   Glucose, Bld 108 (*)    Total Protein 6.3 (*)    All other components within normal limits  URINALYSIS, ROUTINE W REFLEX MICROSCOPIC - Abnormal; Notable for the following components:   Color, Urine COLORLESS (*)    Specific Gravity, Urine 1.004 (*)    All other components within normal limits  RESP PANEL BY RT-PCR (FLU A&B, COVID) ARPGX2  LIPASE, BLOOD  PREGNANCY, URINE    EKG None  Radiology CT Abdomen Pelvis W Contrast  Result Date: 05/31/2020 CLINICAL DATA:  55 year old female with history of right lower quadrant abdominal pain. Suspected acute appendicitis. EXAM: CT ABDOMEN AND PELVIS WITH CONTRAST TECHNIQUE: Multidetector CT  imaging of the abdomen and pelvis was performed using the standard protocol following bolus administration of intravenous contrast. CONTRAST:  80mL OMNIPAQUE IOHEXOL 300 MG/ML  SOLN COMPARISON:  No priors. FINDINGS: Lower chest: Bilateral breast implants are incidentally noted. Hepatobiliary: Multiple subcentimeter low-attenuation lesions in the liver, too small to characterize, but statistically likely to represent tiny cysts. No larger more suspicious appearing hepatic lesions. No intra or extrahepatic  biliary ductal dilatation. Gallbladder is normal in appearance. Pancreas: No pancreatic mass. No pancreatic ductal dilatation. No pancreatic or peripancreatic fluid collections or inflammatory changes. Spleen: Unremarkable. Adrenals/Urinary Tract: Bilateral kidneys and bilateral adrenal glands are normal in appearance. No hydroureteronephrosis. Urinary bladder is normal in appearance. Stomach/Bowel: Normal appearance of the stomach. No pathologic dilatation of small bowel or colon. Numerous colonic diverticulae are noted, without surrounding inflammatory changes to suggest an acute diverticulitis at this time. Appendix: Location: Inferior to the cecum Diameter: 11 mm Appendicolith: None. Mucosal hyper-enhancement: Present Extraluminal gas: None. Periappendiceal collection: None. Small amount of periappendiceal fat stranding. Vascular/Lymphatic: Aortic atherosclerosis, without evidence of aneurysm or dissection in the abdominal or pelvic vasculature. No lymphadenopathy noted in the abdomen or pelvis. Reproductive: Uterus and ovaries are unremarkable in appearance. Other: No significant volume of ascites.  No pneumoperitoneum. Musculoskeletal: There are no aggressive appearing lytic or blastic lesions noted in the visualized portions of the skeleton. IMPRESSION: 1. Findings are concerning for early acute appendicitis, as above. No periappendiceal abscess or signs of frank perforation are identified at this time.  Surgical consultation is recommended. 2. Aortic atherosclerosis. Critical Value/emergent results were called by telephone at the time of interpretation on 05/31/2020 at 1:49 pm to provider [facility] , who verbally acknowledged these results. Electronically Signed   By: Trudie Reed M.D.   On: 05/31/2020 13:50    Procedures Procedures   Medications Ordered in ED Medications  0.9 %  sodium chloride infusion (250 mLs Intravenous New Bag/Given 05/31/20 1411)  0.9 %  sodium chloride infusion (250 mLs Intravenous New Bag/Given 05/31/20 1455)  sodium chloride 0.9 % bolus 1,000 mL (0 mLs Intravenous Stopped 05/31/20 1416)  iohexol (OMNIPAQUE) 9 MG/ML oral solution 1,000 mL (1,000 mLs Oral Contrast Given 05/31/20 1155)  iohexol (OMNIPAQUE) 300 MG/ML solution 80 mL (80 mLs Intravenous Contrast Given 05/31/20 1306)  cefTRIAXone (ROCEPHIN) 2 g in sodium chloride 0.9 % 100 mL IVPB (0 g Intravenous Stopped 05/31/20 1517)    And  metroNIDAZOLE (FLAGYL) IVPB 500 mg (500 mg Intravenous New Bag/Given 05/31/20 1503)  fentaNYL (SUBLIMAZE) injection 50 mcg (50 mcg Intravenous Given 05/31/20 1607)    ED Course  I have reviewed the triage vital signs and the nursing notes.  Pertinent labs & imaging results that were available during my care of the patient were reviewed by me and considered in my medical decision making (see chart for details).  Clinical Course as of 05/31/20 1627  Wed May 31, 2020  1248 Discussed with nurse who is in the OR with Dr. Carolynne Edouard.  He relayed the patient can be transferred to the emergency department, and he will evaluate patient. [MB]  1401 Discussed with ED physician Dr. Madilyn Hook at [facility] who accepts the patient in transfer. [MB]  1402 Reviewed with patient and she is comfortable plan for transfer to [facility] for further evaluation. [MB]    Clinical Course User Index [MB] Terrilee Files, MD   MDM Rules/Calculators/A&P                         This patient complains of back and right  lower quadrant abdominal pain; this involves an extensive number of treatment Options and is a complaint that carries with it a high risk of complications and Morbidity. The differential includes renal colic, appendicitis, pyelonephritis, constipation, mesenteric adenitis, UTI, ovarian torsion, ovarian cyst  I ordered, reviewed and interpreted labs, which included CBC with mildly elevated white count, chemistries normal  other than mildly elevated glucose, urinalysis pregnancy test negative, Covid and flu testing negative I ordered medication IV fluids and IV antibiotics I ordered imaging studies which included CT abdomen and pelvis and I independently    visualized and interpreted imaging which showed acute uncomplicated appendicitis  Previous records obtained and reviewed in epic, no recent admissions I consulted Dr. Carolynne Edouard for general surgery and discussed lab and imaging findings  Critical Interventions: None  After the interventions stated above, I reevaluated the patient and found patient still to have discomfort although mostly in her back.  She is agreeable to plan for transfer to [facility] for further evaluation by general surgery.   Final Clinical Impression(s) / ED Diagnoses Final diagnoses:  Acute appendicitis, unspecified acute appendicitis type    Rx / DC Orders ED Discharge Orders    None       Terrilee Files, MD 05/31/20 [phone]

## 2020-05-31 NOTE — ED Notes (Signed)
Back from CT

## 2020-05-31 NOTE — Progress Notes (Signed)
Received patient from PACU, AOx4, VSS, pain at 2/10, had lap appe, CDI incision, oriented to room, bed controls and plan of care.  NT gave ice water and crackers.  Will monitor.

## 2020-05-31 NOTE — Op Note (Signed)
Melissa Lucas [id number]   PRE-OPERATIVE DIAGNOSIS:  Acute appendicitis  POST-OPERATIVE DIAGNOSIS:  Acute appendicitis without perforation or abscess  Procedure(s): APPENDECTOMY LAPAROSCOPIC  PROCEDURE: Laparoscopic appendectomy  SURGEON:  Stephanie Coup. Cliffton Asters, M.D.  ANESTHESIA: General endotracheal  EBL:   10 mL  DRAINS: None  SPECIMEN:  Appendix  COUNTS:  Sponge, needle and instrument counts were reported correct x2 at conclusion of the operation  DISPOSITION:  PACU in satisfactory condition  COMPLICATIONS: None  FINDINGS: Dilated inflamed appendix consistent clinically with appendicitis. No perforation or abscess. Laparoscopic appendectomy carried out.  DESCRIPTION:  The patient was identified & brought into the operating room. SCDs were in place and functioning. General endotracheal anesthesia was administered. Preoperative antibiotics were administered. The patient was positioned supine with left arm tucked. A foley catheter was inserted under sterile conditions. The abdomen was prepped and draped in the standard sterile fashion. A surgical timeout was called and confirmed our plan.  A small incision was made in the infraumbilical fold. The subcutaneous tissue was dissected and the umbilical stalk identified. The stalk was grasped with a Kocher and retracted outwardly. The infraumbilical fascia was exposed and incised. Peritoneal entry was carefully made bluntly. A 0 Vicryl purse-string suture was placed and then the [facility] port was introduced into the abdomen.  CO2 insufflation commenced to . The laparoscope was inserted and confirmed no evidence of trocar site complications. The patient was then positioned in Trendelenburg. Two additional ports were placed - one in left lower quadrant and another in the suprapubic midline taking care to stay well above the bladder - 3 fingerbreadths above the pubic symphysis. The bed was then slightly tilted to place the left side  down.  The terminal ileum was identified.  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  These were carefully dissected sharply.  The appendix was freed from it surrounding attachments without difficulty. The appendix was elevated.  The base of the appendix was circumferentially dissected taking care to preserve the cecum free of injury. The base was noted to be viable and healthy appearing. The terminal ileum, cecum and ascending colon also appeared normal. The base of the appendix was then stapled with a blue load, taking it flush with the cecum. The mesoappendix was then ligated using the harmonic scalpel, steering clear of the terminal ileum and its mesentery. The mesoappendix was inspected and noted to be hemostatic. The appendix was placed in an EndoBag.  The right lower quadrant was conservatively irrigated. Hemostasis was noted to be achieved - taking time to inspect the ligated mesoappendix, colon mesentery, and retroperitoneum. Staple line was noted to be intact on the cecum with no bleeding. There was no perforation or injury. The right lower quadrant appeared clean and as such, no drain was placed.  The left lower quadrant and suprapubic ports were removed under direct visualization. The EndoBag was then removed through the umbilical port site and passed off as specimen. The CO2 was exhausted from the abdomen. The umbilical fascia was then closed by closing the 0 Vicryl suture. The fascia was palpated and noted to be completely closed. The skin of all port sites was then approximated using 4-0 Monocryl suture. The incisions were covered with Dermabond.  She was then awakened from general anesthesia, extubated, and transferred to a stretcher for transport to recovery in satisfactory condition.

## 2020-05-31 NOTE — ED Triage Notes (Signed)
Pt states she has been experiencing lower back pain that is now radiating toward the right lower quadrant of her abdomen. Pt complains of constipation and bloating for approximately 2 weeks off and on. Pain is intermitent and Pt denis Nausea/vomit.

## 2020-05-31 NOTE — Anesthesia Preprocedure Evaluation (Signed)
Anesthesia Evaluation  Patient identified by MRN, date of birth, ID band Patient awake    Reviewed: Allergy & Precautions, NPO status , Patient's Chart, lab work & pertinent test results  History of Anesthesia Complications Negative for: history of anesthetic complications  Airway Mallampati: III  TM Distance: >3 FB Neck ROM: Full    Dental  (+) Caps, Dental Advisory Given   Pulmonary  05/31/2020 SARS coronavirus NEG   breath sounds clear to auscultation       Cardiovascular negative cardio ROS   Rhythm:Regular Rate:Normal     Neuro/Psych negative neurological ROS     GI/Hepatic Neg liver ROS, abd pain with acute appy   Endo/Other  negative endocrine ROS  Renal/GU negative Renal ROS     Musculoskeletal   Abdominal   Peds  Hematology negative hematology ROS (+)   Anesthesia Other Findings   Reproductive/Obstetrics                             Anesthesia Physical Anesthesia Plan  ASA: I  Anesthesia Plan: General   Post-op Pain Management:    Induction: Intravenous, Rapid sequence and Cricoid pressure planned  PONV Risk Score and Plan: 3 and Ondansetron, Dexamethasone and Scopolamine patch - Pre-op  Airway Management Planned: Oral ETT  Additional Equipment: None  Intra-op Plan:   Post-operative Plan: Extubation in OR  Informed Consent: I have reviewed the patients History and Physical, chart, labs and discussed the procedure including the risks, benefits and alternatives for the proposed anesthesia with the patient or authorized representative who has indicated his/her understanding and acceptance.     Dental advisory given  Plan Discussed with: CRNA and Surgeon  Anesthesia Plan Comments:         Anesthesia Quick Evaluation

## 2020-05-31 NOTE — Anesthesia Postprocedure Evaluation (Signed)
Anesthesia Post Note  Patient: Melissa Lucas  Procedure(s) Performed: APPENDECTOMY LAPAROSCOPIC (N/A Abdomen)     Patient location during evaluation: PACU Anesthesia Type: General Level of consciousness: awake and alert, patient cooperative and oriented Pain management: pain level controlled Vital Signs Assessment: post-procedure vital signs reviewed and stable Respiratory status: spontaneous breathing, nonlabored ventilation and respiratory function stable Cardiovascular status: blood pressure returned to baseline and stable Postop Assessment: no apparent nausea or vomiting Anesthetic complications: no   No complications documented.  Last Vitals:  Vitals:   05/31/20 2015 05/31/20 2030  BP: 139/85 (!) 141/81  Pulse: 80 71  Resp: (!) 25 14  Temp:    SpO2: 99% 95%    Last Pain:  Vitals:   05/31/20 2030  TempSrc:   PainSc: 6                  Iram Lundberg,E. Donyale Falcon

## 2020-05-31 NOTE — ED Notes (Signed)
Report called to [facility] Romeo Apple) and Agricultural consultant at [employer] Asher Muir)

## 2020-05-31 NOTE — H&P (Signed)
CC: RLQ abdominal pain  Requesting provider: Dr. Charm Barges at [facility]  HPI: Melissa Lucas is an 55 y.o. female with hx of PTSD - presented to [facility] with acute onside RLQ pain that really hit her this morning. Has a hx of 1 wk of vague low back pain on right side but was not particularly bothersome and has had this intermittently for sometime now. Suddenly this morning developed sharp non-radiating RLQ pain. Some nausea. No emesis. No fever/chills. Nothing makes it better. Walking makes it more prominent. Pressing on it makes it worse.  Past Medical History:  Diagnosis Date  . Anxiety     Past Surgical History:  Procedure Laterality Date  . FOREARM FRACTURE SURGERY      Family History  Problem Relation Age of Onset  . Diabetes Father   . CAD Father   . Heart disease Father   . Heart attack Maternal Grandfather   . Stroke Maternal Grandfather   . Lung disease Neg Hx     Social:  reports that she has never smoked. She has never used smokeless tobacco. She reports current alcohol use. She reports that she does not use drugs.  Allergies: No Known Allergies  Medications: I have reviewed the patient's current medications.  Results for orders placed or performed during the hospital encounter of 05/31/20 (from the past 48 hour(s))  CBC with Differential     Status: Abnormal   Collection Time: 05/31/20 11:45 AM  Result Value Ref Range   WBC 12.2 (H) 4.0 - 10.5 K/uL   RBC 4.57 3.87 - 5.11 MIL/uL   Hemoglobin 13.1 12.0 - 15.0 g/dL   HCT 10.2 58.5 - 27.7 %   MCV 84.7 80.0 - 100.0 fL   MCH 28.7 26.0 - 34.0 pg   MCHC 33.9 30.0 - 36.0 g/dL   RDW 82.4 23.5 - 36.1 %   Platelets 228 150 - 400 K/uL   nRBC 0.0 0.0 - 0.2 %   Neutrophils Relative % 75 %   Neutro Abs 9.2 (H) 1.7 - 7.7 K/uL   Lymphocytes Relative 16 %   Lymphs Abs 1.9 0.7 - 4.0 K/uL   Monocytes Relative 8 %   Monocytes Absolute 0.9 0.1 - 1.0 K/uL   Eosinophils Relative 1 %   Eosinophils Absolute 0.1 0.0 - 0.5 K/uL   Basophils  Relative 0 %   Basophils Absolute 0.1 0.0 - 0.1 K/uL   Immature Granulocytes 0 %   Abs Immature Granulocytes 0.04 0.00 - 0.07 K/uL  Comprehensive metabolic panel     Status: Abnormal   Collection Time: 05/31/20 11:45 AM  Result Value Ref Range   Sodium 136 135 - 145 mmol/L   Potassium 3.8 3.5 - 5.1 mmol/L   Chloride 103 98 - 111 mmol/L   CO2 25 22 - 32 mmol/L   Glucose, Bld 108 (H) 70 - 99 mg/dL    Comment: Glucose reference range applies only to samples taken after fasting for at least 8 hours.   BUN 11 6 - 20 mg/dL   Creatinine, Ser 4.43 0.44 - 1.00 mg/dL   Calcium 9.0 8.9 - 15.4 mg/dL   Total Protein 6.3 (L) 6.5 - 8.1 g/dL   Albumin 4.2 3.5 - 5.0 g/dL   AST 19 15 - 41 U/L   ALT 17 0 - 44 U/L   Alkaline Phosphatase 46 38 - 126 U/L   Total Bilirubin 0.4 0.3 - 1.2 mg/dL   GFR, Estimated >00 >86 mL/min    Comment: (  NOTE) Calculated using the CKD-EPI Creatinine Equation (2021)    Anion gap 8 5 - 15  Lipase, blood     Status: None   Collection Time: 05/31/20 11:45 AM  Result Value Ref Range   Lipase 24 11 - 51 U/L  Urinalysis, Routine w reflex microscopic Urine, Clean Catch     Status: Abnormal   Collection Time: 05/31/20 11:45 AM  Result Value Ref Range   Color, Urine COLORLESS (A) YELLOW   APPearance CLEAR CLEAR   Specific Gravity, Urine 1.004 (L) 1.005 - 1.030   pH 6.0 5.0 - 8.0   Glucose, UA NEGATIVE NEGATIVE mg/dL   Hgb urine dipstick NEGATIVE NEGATIVE   Bilirubin Urine NEGATIVE NEGATIVE   Ketones, ur NEGATIVE NEGATIVE mg/dL   Protein, ur NEGATIVE NEGATIVE mg/dL   Nitrite NEGATIVE NEGATIVE   Leukocytes,Ua NEGATIVE NEGATIVE  Pregnancy, urine     Status: None   Collection Time: 05/31/20 11:47 AM  Result Value Ref Range   Preg Test, Ur NEGATIVE NEGATIVE    Comment:        THE SENSITIVITY OF THIS METHODOLOGY IS >20 mIU/mL.   Resp Panel by RT-PCR (Flu A&B, Covid) Nasopharyngeal Swab     Status: None   Collection Time: 05/31/20  2:20 PM   Specimen: Nasopharyngeal  Swab; Nasopharyngeal(NP) swabs in vial transport medium  Result Value Ref Range   SARS Coronavirus 2 by RT PCR NEGATIVE NEGATIVE    Comment: (NOTE) SARS-CoV-2 target nucleic acids are NOT DETECTED.  The SARS-CoV-2 RNA is generally detectable in upper respiratory specimens during the acute phase of infection. The lowest concentration of SARS-CoV-2 viral copies this assay can detect is 138 copies/mL. A negative result does not preclude SARS-Cov-2 infection and should not be used as the sole basis for treatment or other patient management decisions. A negative result may occur with  improper specimen collection/handling, submission of specimen other than nasopharyngeal swab, presence of viral mutation(s) within the areas targeted by this assay, and inadequate number of viral copies(<138 copies/mL). A negative result must be combined with clinical observations, patient history, and epidemiological information. The expected result is Negative.  Fact Sheet for Patients:  BloggerCourse.com  Fact Sheet for Healthcare Providers:  SeriousBroker.it  This test is no t yet approved or cleared by the [place] FDA and  has been authorized for detection and/or diagnosis of SARS-CoV-2 by FDA under an Emergency Use Authorization (EUA). This EUA will remain  in effect (meaning this test can be used) for the duration of the COVID-19 declaration under Section 564(b)(1) of the Act, 21 U.S.C.section 360bbb-3(b)(1), unless the authorization is terminated  or revoked sooner.       Influenza A by PCR NEGATIVE NEGATIVE   Influenza B by PCR NEGATIVE NEGATIVE    Comment: (NOTE) The Xpert Xpress SARS-CoV-2/FLU/RSV plus assay is intended as an aid in the diagnosis of influenza from Nasopharyngeal swab specimens and should not be used as a sole basis for treatment. Nasal washings and aspirates are unacceptable for Xpert Xpress  SARS-CoV-2/FLU/RSV testing.  Fact Sheet for Patients: BloggerCourse.com  Fact Sheet for Healthcare Providers: SeriousBroker.it  This test is not yet approved or cleared by the [place] FDA and has been authorized for detection and/or diagnosis of SARS-CoV-2 by FDA under an Emergency Use Authorization (EUA). This EUA will remain in effect (meaning this test can be used) for the duration of the COVID-19 declaration under Section 564(b)(1) of the Act, 21 U.S.C. section 360bbb-3(b)(1), unless the authorization is terminated  or revoked.      CT Abdomen Pelvis W Contrast  Result Date: 05/31/2020 CLINICAL DATA:  55 year old female with history of right lower quadrant abdominal pain. Suspected acute appendicitis. EXAM: CT ABDOMEN AND PELVIS WITH CONTRAST TECHNIQUE: Multidetector CT imaging of the abdomen and pelvis was performed using the standard protocol following bolus administration of intravenous contrast. CONTRAST:  18mL OMNIPAQUE IOHEXOL 300 MG/ML  SOLN COMPARISON:  No priors. FINDINGS: Lower chest: Bilateral breast implants are incidentally noted. Hepatobiliary: Multiple subcentimeter low-attenuation lesions in the liver, too small to characterize, but statistically likely to represent tiny cysts. No larger more suspicious appearing hepatic lesions. No intra or extrahepatic biliary ductal dilatation. Gallbladder is normal in appearance. Pancreas: No pancreatic mass. No pancreatic ductal dilatation. No pancreatic or peripancreatic fluid collections or inflammatory changes. Spleen: Unremarkable. Adrenals/Urinary Tract: Bilateral kidneys and bilateral adrenal glands are normal in appearance. No hydroureteronephrosis. Urinary bladder is normal in appearance. Stomach/Bowel: Normal appearance of the stomach. No pathologic dilatation of small bowel or colon. Numerous colonic diverticulae are noted, without surrounding inflammatory changes to  suggest an acute diverticulitis at this time. Appendix: Location: Inferior to the cecum Diameter: 11 mm Appendicolith: None. Mucosal hyper-enhancement: Present Extraluminal gas: None. Periappendiceal collection: None. Small amount of periappendiceal fat stranding. Vascular/Lymphatic: Aortic atherosclerosis, without evidence of aneurysm or dissection in the abdominal or pelvic vasculature. No lymphadenopathy noted in the abdomen or pelvis. Reproductive: Uterus and ovaries are unremarkable in appearance. Other: No significant volume of ascites.  No pneumoperitoneum. Musculoskeletal: There are no aggressive appearing lytic or blastic lesions noted in the visualized portions of the skeleton. IMPRESSION: 1. Findings are concerning for early acute appendicitis, as above. No periappendiceal abscess or signs of frank perforation are identified at this time. Surgical consultation is recommended. 2. Aortic atherosclerosis. Critical Value/emergent results were called by telephone at the time of interpretation on 05/31/2020 at 1:49 pm to provider [facility] , who verbally acknowledged these results. Electronically Signed   By: Trudie Reed M.D.   On: 05/31/2020 13:50    ROS - all of the below systems have been reviewed with the patient and positives are indicated with bold text General: chills, fever or night sweats Eyes: blurry vision or double vision ENT: epistaxis or sore throat Allergy/Immunology: itchy/watery eyes or nasal congestion Hematologic/Lymphatic: bleeding problems, blood clots or swollen lymph nodes Endocrine: temperature intolerance or unexpected weight changes Breast: new or changing breast lumps or nipple discharge Resp: cough, shortness of breath, or wheezing CV: chest pain or dyspnea on exertion GI: as per HPI GU: dysuria, trouble voiding, or hematuria MSK: joint pain or joint stiffness Neuro: TIA or stroke symptoms Derm: pruritus and skin lesion changes Psych: anxiety and  depression  PE Blood pressure 130/81, pulse 76, temperature 98.5 F (36.9 C), temperature source Oral, resp. rate 16, height 5\' 7"  (1.702 m), weight 65.8 kg, SpO2 97 %. Constitutional: NAD; conversant; no deformities; wearing mask Eyes: Moist conjunctiva; no lid lag; anicteric; pupils equal and round Neck: Trachea midline; no thyromegaly Lungs: Normal respiratory effort; no tactile fremitus CV: RRR; no palpable thrills; no pitting edema GI: Abd soft, focally tender in RLQ with focal voluntary guarding; no rebound; no palpable hepatosplenomegaly MSK: Normal range of motion of extremities; no clubbing/cyanosis Psychiatric: Appropriate affect; alert and oriented x3 Lymphatic: No palpable cervical or axillary lymphadenopathy  Results for orders placed or performed during the hospital encounter of 05/31/20 (from the past 48 hour(s))  CBC with Differential     Status: Abnormal  Collection Time: 05/31/20 11:45 AM  Result Value Ref Range   WBC 12.2 (H) 4.0 - 10.5 K/uL   RBC 4.57 3.87 - 5.11 MIL/uL   Hemoglobin 13.1 12.0 - 15.0 g/dL   HCT 40.938.7 81.136.0 - 91.446.0 %   MCV 84.7 80.0 - 100.0 fL   MCH 28.7 26.0 - 34.0 pg   MCHC 33.9 30.0 - 36.0 g/dL   RDW 78.213.3 95.611.5 - 21.315.5 %   Platelets 228 150 - 400 K/uL   nRBC 0.0 0.0 - 0.2 %   Neutrophils Relative % 75 %   Neutro Abs 9.2 (H) 1.7 - 7.7 K/uL   Lymphocytes Relative 16 %   Lymphs Abs 1.9 0.7 - 4.0 K/uL   Monocytes Relative 8 %   Monocytes Absolute 0.9 0.1 - 1.0 K/uL   Eosinophils Relative 1 %   Eosinophils Absolute 0.1 0.0 - 0.5 K/uL   Basophils Relative 0 %   Basophils Absolute 0.1 0.0 - 0.1 K/uL   Immature Granulocytes 0 %   Abs Immature Granulocytes 0.04 0.00 - 0.07 K/uL  Comprehensive metabolic panel     Status: Abnormal   Collection Time: 05/31/20 11:45 AM  Result Value Ref Range   Sodium 136 135 - 145 mmol/L   Potassium 3.8 3.5 - 5.1 mmol/L   Chloride 103 98 - 111 mmol/L   CO2 25 22 - 32 mmol/L   Glucose, Bld 108 (H) 70 - 99 mg/dL     Comment: Glucose reference range applies only to samples taken after fasting for at least 8 hours.   BUN 11 6 - 20 mg/dL   Creatinine, Ser 0.860.57 0.44 - 1.00 mg/dL   Calcium 9.0 8.9 - 57.810.3 mg/dL   Total Protein 6.3 (L) 6.5 - 8.1 g/dL   Albumin 4.2 3.5 - 5.0 g/dL   AST 19 15 - 41 U/L   ALT 17 0 - 44 U/L   Alkaline Phosphatase 46 38 - 126 U/L   Total Bilirubin 0.4 0.3 - 1.2 mg/dL   GFR, Estimated >46>60 >96>60 mL/min    Comment: (NOTE) Calculated using the CKD-EPI Creatinine Equation (2021)    Anion gap 8 5 - 15  Lipase, blood     Status: None   Collection Time: 05/31/20 11:45 AM  Result Value Ref Range   Lipase 24 11 - 51 U/L  Urinalysis, Routine w reflex microscopic Urine, Clean Catch     Status: Abnormal   Collection Time: 05/31/20 11:45 AM  Result Value Ref Range   Color, Urine COLORLESS (A) YELLOW   APPearance CLEAR CLEAR   Specific Gravity, Urine 1.004 (L) 1.005 - 1.030   pH 6.0 5.0 - 8.0   Glucose, UA NEGATIVE NEGATIVE mg/dL   Hgb urine dipstick NEGATIVE NEGATIVE   Bilirubin Urine NEGATIVE NEGATIVE   Ketones, ur NEGATIVE NEGATIVE mg/dL   Protein, ur NEGATIVE NEGATIVE mg/dL   Nitrite NEGATIVE NEGATIVE   Leukocytes,Ua NEGATIVE NEGATIVE  Pregnancy, urine     Status: None   Collection Time: 05/31/20 11:47 AM  Result Value Ref Range   Preg Test, Ur NEGATIVE NEGATIVE    Comment:        THE SENSITIVITY OF THIS METHODOLOGY IS >20 mIU/mL.   Resp Panel by RT-PCR (Flu A&B, Covid) Nasopharyngeal Swab     Status: None   Collection Time: 05/31/20  2:20 PM   Specimen: Nasopharyngeal Swab; Nasopharyngeal(NP) swabs in vial transport medium  Result Value Ref Range   SARS Coronavirus 2 by RT PCR NEGATIVE NEGATIVE  Comment: (NOTE) SARS-CoV-2 target nucleic acids are NOT DETECTED.  The SARS-CoV-2 RNA is generally detectable in upper respiratory specimens during the acute phase of infection. The lowest concentration of SARS-CoV-2 viral copies this assay can detect is 138 copies/mL. A  negative result does not preclude SARS-Cov-2 infection and should not be used as the sole basis for treatment or other patient management decisions. A negative result may occur with  improper specimen collection/handling, submission of specimen other than nasopharyngeal swab, presence of viral mutation(s) within the areas targeted by this assay, and inadequate number of viral copies(<138 copies/mL). A negative result must be combined with clinical observations, patient history, and epidemiological information. The expected result is Negative.  Fact Sheet for Patients:  BloggerCourse.com  Fact Sheet for Healthcare Providers:  SeriousBroker.it  This test is no t yet approved or cleared by the [place] FDA and  has been authorized for detection and/or diagnosis of SARS-CoV-2 by FDA under an Emergency Use Authorization (EUA). This EUA will remain  in effect (meaning this test can be used) for the duration of the COVID-19 declaration under Section 564(b)(1) of the Act, 21 U.S.C.section 360bbb-3(b)(1), unless the authorization is terminated  or revoked sooner.       Influenza A by PCR NEGATIVE NEGATIVE   Influenza B by PCR NEGATIVE NEGATIVE    Comment: (NOTE) The Xpert Xpress SARS-CoV-2/FLU/RSV plus assay is intended as an aid in the diagnosis of influenza from Nasopharyngeal swab specimens and should not be used as a sole basis for treatment. Nasal washings and aspirates are unacceptable for Xpert Xpress SARS-CoV-2/FLU/RSV testing.  Fact Sheet for Patients: BloggerCourse.com  Fact Sheet for Healthcare Providers: SeriousBroker.it  This test is not yet approved or cleared by the [place] FDA and has been authorized for detection and/or diagnosis of SARS-CoV-2 by FDA under an Emergency Use Authorization (EUA). This EUA will remain in effect (meaning this test can be used)  for the duration of the COVID-19 declaration under Section 564(b)(1) of the Act, 21 U.S.C. section 360bbb-3(b)(1), unless the authorization is terminated or revoked.      CT Abdomen Pelvis W Contrast  Result Date: 05/31/2020 CLINICAL DATA:  55 year old female with history of right lower quadrant abdominal pain. Suspected acute appendicitis. EXAM: CT ABDOMEN AND PELVIS WITH CONTRAST TECHNIQUE: Multidetector CT imaging of the abdomen and pelvis was performed using the standard protocol following bolus administration of intravenous contrast. CONTRAST:  60mL OMNIPAQUE IOHEXOL 300 MG/ML  SOLN COMPARISON:  No priors. FINDINGS: Lower chest: Bilateral breast implants are incidentally noted. Hepatobiliary: Multiple subcentimeter low-attenuation lesions in the liver, too small to characterize, but statistically likely to represent tiny cysts. No larger more suspicious appearing hepatic lesions. No intra or extrahepatic biliary ductal dilatation. Gallbladder is normal in appearance. Pancreas: No pancreatic mass. No pancreatic ductal dilatation. No pancreatic or peripancreatic fluid collections or inflammatory changes. Spleen: Unremarkable. Adrenals/Urinary Tract: Bilateral kidneys and bilateral adrenal glands are normal in appearance. No hydroureteronephrosis. Urinary bladder is normal in appearance. Stomach/Bowel: Normal appearance of the stomach. No pathologic dilatation of small bowel or colon. Numerous colonic diverticulae are noted, without surrounding inflammatory changes to suggest an acute diverticulitis at this time. Appendix: Location: Inferior to the cecum Diameter: 11 mm Appendicolith: None. Mucosal hyper-enhancement: Present Extraluminal gas: None. Periappendiceal collection: None. Small amount of periappendiceal fat stranding. Vascular/Lymphatic: Aortic atherosclerosis, without evidence of aneurysm or dissection in the abdominal or pelvic vasculature. No lymphadenopathy noted in the abdomen or pelvis.  Reproductive: Uterus and ovaries are unremarkable in  appearance. Other: No significant volume of ascites.  No pneumoperitoneum. Musculoskeletal: There are no aggressive appearing lytic or blastic lesions noted in the visualized portions of the skeleton. IMPRESSION: 1. Findings are concerning for early acute appendicitis, as above. No periappendiceal abscess or signs of frank perforation are identified at this time. Surgical consultation is recommended. 2. Aortic atherosclerosis. Critical Value/emergent results were called by telephone at the time of interpretation on 05/31/2020 at 1:49 pm to provider [facility] , who verbally acknowledged these results. Electronically Signed   By: Trudie Reed M.D.   On: 05/31/2020 13:50     A/P: Aylissa Heinemann is an 55 y.o. female with hx of PTSD here with acute appendicitis  -The anatomy and physiology of the GI tract was reviewed with her. The pathophysiology of appendicitis was reviewed as well -We discussed options moving forward for treatment, covering both IV abx and surgery surgery. We discussed that with antibiotics alone, there is reasonable success in managing appendicitis, however, risks of recurrence at 37yrs being as high as 40% in some studies. We discussed appendectomy - laparoscopic and potential open techniques. We discussed the material risks (including, but not limited to, pain, bleeding, infection, scarring, need for blood transfusion, damage to surrounding structures- blood vessels/nerves/viscus/organs, damage to ureter, urine leak, leak from staple line, need for additional procedures, hernia, recurrence although quite low with surgery, pneumonia, heart attack, stroke, death) benefits and alternatives to surgery were discussed. The patient's questions were answered to her satisfaction, she voiced understanding and elected to proceed with surgery. Additionally, we discussed typical postoperative expectations and the recovery process.  Marin Olp, MD [facility] Use [email or web address] to contact on call provider

## 2020-06-01 ENCOUNTER — Encounter (HOSPITAL_COMMUNITY): Payer: Self-pay | Admitting: Surgery

## 2020-06-01 DIAGNOSIS — Z9049 Acquired absence of other specified parts of digestive tract: Secondary | ICD-10-CM

## 2020-06-01 LAB — CBC
HCT: 38.6 % (ref 36.0–46.0)
Hemoglobin: 12.7 g/dL (ref 12.0–15.0)
MCH: 28.6 pg (ref 26.0–34.0)
MCHC: 32.9 g/dL (ref 30.0–36.0)
MCV: 86.9 fL (ref 80.0–100.0)
Platelets: 229 10*3/uL (ref 150–400)
RBC: 4.44 MIL/uL (ref 3.87–5.11)
RDW: 13.4 % (ref 11.5–15.5)
WBC: 10.8 10*3/uL — ABNORMAL HIGH (ref 4.0–10.5)
nRBC: 0 % (ref 0.0–0.2)

## 2020-06-01 MED ORDER — HEPARIN SODIUM (PORCINE) 5000 UNIT/ML IJ SOLN
5000.0000 [IU] | Freq: Three times a day (TID) | INTRAMUSCULAR | Status: DC
  Filled 2020-06-01: qty 1

## 2020-06-01 MED ORDER — TRAMADOL HCL 50 MG PO TABS: 50.0000 mg | ORAL_TABLET | Freq: Four times a day (QID) | ORAL | 0 refills | Status: DC | PRN

## 2020-06-01 MED ORDER — MIRTAZAPINE 15 MG PO TABS: 15.0000 mg | ORAL_TABLET | Freq: Every day | ORAL | Status: DC

## 2020-06-01 MED ORDER — ACETAMINOPHEN 500 MG PO TABS
1000.0000 mg | ORAL_TABLET | Freq: Three times a day (TID) | ORAL | 0 refills | Status: AC | PRN
Start: 2020-06-01 — End: 2020-06-06

## 2020-06-01 MED ORDER — HYDROMORPHONE HCL 1 MG/ML IJ SOLN
0.5000 mg | INTRAMUSCULAR | Status: DC | PRN
Start: 2020-06-01 — End: 2020-06-01

## 2020-06-01 MED ORDER — ONDANSETRON 4 MG PO TBDP: 4.0000 mg | ORAL_TABLET | Freq: Four times a day (QID) | ORAL | Status: DC | PRN

## 2020-06-01 MED ORDER — ONDANSETRON HCL 4 MG/2ML IJ SOLN: 4.0000 mg | Freq: Four times a day (QID) | INTRAMUSCULAR | Status: DC | PRN

## 2020-06-01 MED ORDER — SIMETHICONE 80 MG PO CHEW
40.0000 mg | CHEWABLE_TABLET | Freq: Four times a day (QID) | ORAL | Status: DC | PRN
  Administered 2020-06-01: 40 mg via ORAL
  Filled 2020-06-01: qty 1

## 2020-06-01 MED ORDER — DOCUSATE SODIUM 100 MG PO CAPS: 100.0000 mg | ORAL_CAPSULE | Freq: Two times a day (BID) | ORAL | 0 refills | Status: DC | PRN

## 2020-06-01 MED ORDER — ALPRAZOLAM 0.5 MG PO TABS: 1.0000 mg | ORAL_TABLET | Freq: Two times a day (BID) | ORAL | Status: DC | PRN

## 2020-06-01 MED ORDER — DOCUSATE SODIUM 100 MG PO CAPS
100.0000 mg | ORAL_CAPSULE | Freq: Two times a day (BID) | ORAL | Status: DC
  Administered 2020-06-01 (×2): 100 mg via ORAL
  Filled 2020-06-01 (×2): qty 1

## 2020-06-01 MED ORDER — TRAZODONE HCL 100 MG PO TABS: 100.0000 mg | ORAL_TABLET | Freq: Every day | ORAL | Status: DC

## 2020-06-01 MED ORDER — LACTATED RINGERS IV SOLN: INTRAVENOUS | Status: DC

## 2020-06-01 NOTE — Discharge Instructions (Signed)
[facility] ° °Please arrive at least 30 min before your appointment to complete your check in paperwork.  If you are unable to arrive 30 min prior to your appointment time we may have to cancel or reschedule you. °LAPAROSCOPIC SURGERY: POST OP INSTRUCTIONS °Always review your discharge instruction sheet given to you by the facility where your surgery was performed. °IF YOU HAVE DISABILITY OR FAMILY LEAVE FORMS, YOU MUST BRING THEM TO THE OFFICE FOR PROCESSING.   °DO NOT GIVE THEM TO YOUR DOCTOR. ° °PAIN CONTROL ° °1. First take acetaminophen (Tylenol) AND/or ibuprofen (Advil) to control your pain after surgery.  Follow directions on package.  Taking acetaminophen (Tylenol) and/or ibuprofen (Advil) regularly after surgery will help to control your pain and lower the amount of prescription pain medication you may need.  You should not take more than 4,000 mg (4 grams) of acetaminophen (Tylenol) in 24 hours.  You should not take ibuprofen (Advil), aleve, motrin, naprosyn or other NSAIDS if you have a history of stomach ulcers or chronic kidney disease.  °2. A prescription for pain medication may be given to you upon discharge.  Take your pain medication as prescribed, if you still have uncontrolled pain after taking acetaminophen (Tylenol) or ibuprofen (Advil). °3. Use ice packs to help control pain. °4. If you need a refill on your pain medication, please contact your pharmacy.  They will contact our office to request authorization. Prescriptions will not be filled after 5pm or on week-ends. ° °HOME MEDICATIONS °5. Take your usually prescribed medications unless otherwise directed. ° °DIET °6. You should follow a light diet the first few days after arrival home.  Be sure to include lots of fluids daily. Avoid fatty, fried foods.  ° °CONSTIPATION °7. It is common to experience some constipation after surgery and if you are taking pain medication.  Increasing fluid intake and taking a stool  softener (such as Colace) will usually help or prevent this problem from occurring.  A mild laxative (Milk of Magnesia or Miralax) should be taken according to package instructions if there are no bowel movements after 48 hours. ° °WOUND/INCISION CARE °8. Most patients will experience some swelling and bruising in the area of the incisions.  Ice packs will help.  Swelling and bruising can take several days to resolve.  °9. Unless discharge instructions indicate otherwise, follow guidelines below  °a. STERI-STRIPS - you may remove your outer bandages 48 hours after surgery, and you may shower at that time.  You have steri-strips (small skin tapes) in place directly over the incision.  These strips should be left on the skin for 7-10 days.   °b. DERMABOND/SKIN GLUE - you may shower in 24 hours.  The glue will flake off over the next 2-3 weeks. °10. Any sutures or staples will be removed at the office during your follow-up visit. ° °ACTIVITIES °11. You may resume regular (light) daily activities beginning the next day--such as daily self-care, walking, climbing stairs--gradually increasing activities as tolerated.  You may have sexual intercourse when it is comfortable.  Refrain from any heavy lifting or straining until approved by your doctor. °a. You may drive when you are no longer taking prescription pain medication, you can comfortably wear a seatbelt, and you can safely maneuver your car and apply brakes. ° °FOLLOW-UP °12. You should see your doctor in the office for a follow-up appointment approximately 2-3 weeks after your surgery.  You should have been given your post-op/follow-up appointment when   your surgery was scheduled.  If you did not receive a post-op/follow-up appointment, make sure that you call for this appointment within a day or two after you arrive home to insure a convenient appointment time. ° ° °WHEN TO CALL YOUR DOCTOR: °1. Fever over 101.0 °2. Inability to urinate °3. Continued bleeding from  incision. °4. Increased pain, redness, or drainage from the incision. °5. Increasing abdominal pain ° °The clinic staff is available to answer your questions during regular business hours.  Please don’t hesitate to call and ask to speak to one of the nurses for clinical concerns.  If you have a medical emergency, go to the nearest emergency room or call 911.  A surgeon from [facility] is always on call at the hospital. °[address], [address], [place], [place]  [address] ? [address] [address], [place], [place]   [address] °[phone] ? [phone] ? FAX [phone] ° °

## 2020-06-01 NOTE — Progress Notes (Signed)
Patient discharged to home. Verbalizes understanding of all discharge instructions including incision care, discharge medications, and follow up MD visits.    Awaiting ride arrival.

## 2020-06-01 NOTE — Discharge Summary (Signed)
° ° °  Patient ID: Melissa Lucas [id number] Jan 01, 1966 55 y.o.  Admit date: 05/31/2020 Discharge date: 06/01/2020  Admitting Diagnosis: Acute appendicitis  Discharge Diagnosis Acute appendicitis  Consultants None   H&P:  Melissa Lucas is an 55 y.o. female with hx of PTSD - presented to [facility] with acute onside RLQ pain that really hit her this morning. Has a hx of 1 wk of vague low back pain on right side but was not particularly bothersome and has had this intermittently for sometime now. Suddenly this morning developed sharp non-radiating RLQ pain. Some nausea. No emesis. No fever/chills. Nothing makes it better. Walking makes it more prominent. Pressing on it makes it worse.  Procedures Dr. Cliffton Asters - Laparoscopic Appendectomy - 05/31/2020  Hospital Course:  The patient was admitted and underwent a laparoscopic appendectomy.  The patient tolerated the procedure well.  On POD 1, the patient was tolerating a regular diet, voiding well, mobilizing, and pain was controlled with oral pain medications.  The patient was stable for DC home at this time with appropriate follow up made. Follow up as noted below.   Physical Exam: Gen:  Alert, NAD, pleasant Card:  Reg Pulm:  CTAB, no W/R/R, effort normal Abd: Soft, ND, appropriately tender around laparoscopic incisions, +BS, Incisions with glue intact appears well and are without drainage, bleeding, or signs of infection Ext:  No LE edema  Psych: A&Ox3  Skin: no rashes noted, warm and dry  Allergies as of 06/01/2020   No Known Allergies     Medication List    TAKE these medications   acetaminophen 500 MG tablet Commonly known as: TYLENOL Take 2 tablets (1,000 mg total) by mouth every 8 (eight) hours as needed for up to 5 days.   ALPRAZolam 1 MG tablet Commonly known as: XANAX Take 1 mg by mouth 2 (two) times daily as needed for anxiety.   amphetamine-dextroamphetamine 30 MG tablet Commonly known as: ADDERALL Take 30 mg by mouth 2 (two)  times daily.   docusate sodium 100 MG capsule Commonly known as: COLACE Take 1 capsule (100 mg total) by mouth 2 (two) times daily as needed for mild constipation.   mirtazapine 30 MG tablet Commonly known as: REMERON Take 15 mg by mouth at bedtime.   traMADol 50 MG tablet Commonly known as: ULTRAM Take 1 tablet (50 mg total) by mouth every 6 (six) hours as needed (breakthrough pain).   traZODone 100 MG tablet Commonly known as: DESYREL Take 100 mg by mouth at bedtime.         Follow-up Information    [facility], [facility]. Go on 06/22/2020.   Specialty: General Surgery Why: @ 345pm. Please arrive 30 minutes prior to your appointment for paperwork. Please bring a copy of your photo ID and insurance card.  Contact information: [address] [place] [address] [phone]               Signed: Leary Roca, [facility] 06/01/2020, 9:41 AM Please see Amion for pager number during day hours 7:00am-4:30pm

## 2020-06-02 LAB — SURGICAL PATHOLOGY

## 2020-11-01 DIAGNOSIS — H16143 Punctate keratitis, bilateral: Secondary | ICD-10-CM | POA: Diagnosis not present

## 2020-11-02 DIAGNOSIS — Z01419 Encounter for gynecological examination (general) (routine) without abnormal findings: Secondary | ICD-10-CM | POA: Diagnosis not present

## 2020-11-02 DIAGNOSIS — R87810 Cervical high risk human papillomavirus (HPV) DNA test positive: Secondary | ICD-10-CM | POA: Diagnosis not present

## 2020-11-02 DIAGNOSIS — Z1231 Encounter for screening mammogram for malignant neoplasm of breast: Secondary | ICD-10-CM | POA: Diagnosis not present

## 2020-11-02 DIAGNOSIS — Z124 Encounter for screening for malignant neoplasm of cervix: Secondary | ICD-10-CM | POA: Diagnosis not present

## 2020-11-02 DIAGNOSIS — Z6822 Body mass index (BMI) 22.0-22.9, adult: Secondary | ICD-10-CM | POA: Diagnosis not present

## 2020-11-04 DIAGNOSIS — F410 Panic disorder [episodic paroxysmal anxiety] without agoraphobia: Secondary | ICD-10-CM | POA: Diagnosis not present

## 2020-11-04 DIAGNOSIS — F900 Attention-deficit hyperactivity disorder, predominantly inattentive type: Secondary | ICD-10-CM | POA: Diagnosis not present

## 2020-11-04 DIAGNOSIS — F4321 Adjustment disorder with depressed mood: Secondary | ICD-10-CM | POA: Diagnosis not present

## 2021-02-26 DIAGNOSIS — H02834 Dermatochalasis of left upper eyelid: Secondary | ICD-10-CM | POA: Diagnosis not present

## 2021-02-26 DIAGNOSIS — H04123 Dry eye syndrome of bilateral lacrimal glands: Secondary | ICD-10-CM | POA: Diagnosis not present

## 2021-02-26 DIAGNOSIS — H02831 Dermatochalasis of right upper eyelid: Secondary | ICD-10-CM | POA: Diagnosis not present

## 2021-02-26 DIAGNOSIS — H17823 Peripheral opacity of cornea, bilateral: Secondary | ICD-10-CM | POA: Diagnosis not present

## 2021-03-02 DIAGNOSIS — M5451 Vertebrogenic low back pain: Secondary | ICD-10-CM | POA: Diagnosis not present

## 2021-03-02 DIAGNOSIS — M9903 Segmental and somatic dysfunction of lumbar region: Secondary | ICD-10-CM | POA: Diagnosis not present

## 2021-03-02 DIAGNOSIS — M9902 Segmental and somatic dysfunction of thoracic region: Secondary | ICD-10-CM | POA: Diagnosis not present

## 2021-03-02 DIAGNOSIS — M9905 Segmental and somatic dysfunction of pelvic region: Secondary | ICD-10-CM | POA: Diagnosis not present

## 2021-03-05 DIAGNOSIS — F900 Attention-deficit hyperactivity disorder, predominantly inattentive type: Secondary | ICD-10-CM | POA: Diagnosis not present

## 2021-03-05 DIAGNOSIS — F4321 Adjustment disorder with depressed mood: Secondary | ICD-10-CM | POA: Diagnosis not present

## 2021-03-05 DIAGNOSIS — F410 Panic disorder [episodic paroxysmal anxiety] without agoraphobia: Secondary | ICD-10-CM | POA: Diagnosis not present

## 2021-03-08 DIAGNOSIS — M9902 Segmental and somatic dysfunction of thoracic region: Secondary | ICD-10-CM | POA: Diagnosis not present

## 2021-03-08 DIAGNOSIS — M9903 Segmental and somatic dysfunction of lumbar region: Secondary | ICD-10-CM | POA: Diagnosis not present

## 2021-03-08 DIAGNOSIS — M9905 Segmental and somatic dysfunction of pelvic region: Secondary | ICD-10-CM | POA: Diagnosis not present

## 2021-03-08 DIAGNOSIS — M5451 Vertebrogenic low back pain: Secondary | ICD-10-CM | POA: Diagnosis not present

## 2021-04-26 DIAGNOSIS — F331 Major depressive disorder, recurrent, moderate: Secondary | ICD-10-CM | POA: Diagnosis not present

## 2021-04-26 DIAGNOSIS — F332 Major depressive disorder, recurrent severe without psychotic features: Secondary | ICD-10-CM | POA: Diagnosis not present

## 2021-04-26 DIAGNOSIS — F900 Attention-deficit hyperactivity disorder, predominantly inattentive type: Secondary | ICD-10-CM | POA: Diagnosis not present

## 2021-04-26 DIAGNOSIS — F410 Panic disorder [episodic paroxysmal anxiety] without agoraphobia: Secondary | ICD-10-CM | POA: Diagnosis not present

## 2021-05-07 DIAGNOSIS — F332 Major depressive disorder, recurrent severe without psychotic features: Secondary | ICD-10-CM | POA: Diagnosis not present

## 2021-05-07 DIAGNOSIS — F5101 Primary insomnia: Secondary | ICD-10-CM | POA: Diagnosis not present

## 2021-05-07 DIAGNOSIS — F410 Panic disorder [episodic paroxysmal anxiety] without agoraphobia: Secondary | ICD-10-CM | POA: Diagnosis not present

## 2021-05-07 DIAGNOSIS — F4321 Adjustment disorder with depressed mood: Secondary | ICD-10-CM | POA: Diagnosis not present

## 2021-05-08 DIAGNOSIS — Z20822 Contact with and (suspected) exposure to covid-19: Secondary | ICD-10-CM | POA: Diagnosis not present

## 2021-05-10 DIAGNOSIS — E785 Hyperlipidemia, unspecified: Secondary | ICD-10-CM | POA: Diagnosis not present

## 2021-05-10 DIAGNOSIS — F4310 Post-traumatic stress disorder, unspecified: Secondary | ICD-10-CM | POA: Diagnosis not present

## 2021-05-10 DIAGNOSIS — Z0000 Encounter for general adult medical examination without abnormal findings: Secondary | ICD-10-CM | POA: Diagnosis not present

## 2021-05-11 DIAGNOSIS — F4310 Post-traumatic stress disorder, unspecified: Secondary | ICD-10-CM | POA: Diagnosis not present

## 2021-05-15 DIAGNOSIS — Z0000 Encounter for general adult medical examination without abnormal findings: Secondary | ICD-10-CM | POA: Diagnosis not present

## 2021-05-15 DIAGNOSIS — E785 Hyperlipidemia, unspecified: Secondary | ICD-10-CM | POA: Diagnosis not present

## 2021-05-23 DIAGNOSIS — F4310 Post-traumatic stress disorder, unspecified: Secondary | ICD-10-CM | POA: Diagnosis not present

## 2021-05-25 DIAGNOSIS — F4310 Post-traumatic stress disorder, unspecified: Secondary | ICD-10-CM | POA: Diagnosis not present

## 2021-05-28 DIAGNOSIS — F4310 Post-traumatic stress disorder, unspecified: Secondary | ICD-10-CM | POA: Diagnosis not present

## 2021-05-30 DIAGNOSIS — F4310 Post-traumatic stress disorder, unspecified: Secondary | ICD-10-CM | POA: Diagnosis not present

## 2021-06-01 DIAGNOSIS — F4310 Post-traumatic stress disorder, unspecified: Secondary | ICD-10-CM | POA: Diagnosis not present

## 2021-06-04 DIAGNOSIS — F4310 Post-traumatic stress disorder, unspecified: Secondary | ICD-10-CM | POA: Diagnosis not present

## 2021-06-06 DIAGNOSIS — F4310 Post-traumatic stress disorder, unspecified: Secondary | ICD-10-CM | POA: Diagnosis not present

## 2021-06-11 DIAGNOSIS — F4310 Post-traumatic stress disorder, unspecified: Secondary | ICD-10-CM | POA: Diagnosis not present

## 2021-06-13 DIAGNOSIS — F4310 Post-traumatic stress disorder, unspecified: Secondary | ICD-10-CM | POA: Diagnosis not present

## 2021-06-15 DIAGNOSIS — F4310 Post-traumatic stress disorder, unspecified: Secondary | ICD-10-CM | POA: Diagnosis not present

## 2021-06-18 DIAGNOSIS — F4310 Post-traumatic stress disorder, unspecified: Secondary | ICD-10-CM | POA: Diagnosis not present

## 2021-06-20 DIAGNOSIS — F4310 Post-traumatic stress disorder, unspecified: Secondary | ICD-10-CM | POA: Diagnosis not present

## 2021-06-22 DIAGNOSIS — F4310 Post-traumatic stress disorder, unspecified: Secondary | ICD-10-CM | POA: Diagnosis not present

## 2021-06-25 DIAGNOSIS — F4310 Post-traumatic stress disorder, unspecified: Secondary | ICD-10-CM | POA: Diagnosis not present

## 2021-06-27 DIAGNOSIS — F4310 Post-traumatic stress disorder, unspecified: Secondary | ICD-10-CM | POA: Diagnosis not present

## 2021-07-02 DIAGNOSIS — F4310 Post-traumatic stress disorder, unspecified: Secondary | ICD-10-CM | POA: Diagnosis not present

## 2021-07-04 DIAGNOSIS — F4310 Post-traumatic stress disorder, unspecified: Secondary | ICD-10-CM | POA: Diagnosis not present

## 2021-07-06 DIAGNOSIS — F4310 Post-traumatic stress disorder, unspecified: Secondary | ICD-10-CM | POA: Diagnosis not present

## 2021-07-09 DIAGNOSIS — F4310 Post-traumatic stress disorder, unspecified: Secondary | ICD-10-CM | POA: Diagnosis not present

## 2021-07-13 DIAGNOSIS — F4310 Post-traumatic stress disorder, unspecified: Secondary | ICD-10-CM | POA: Diagnosis not present

## 2021-07-16 DIAGNOSIS — F4310 Post-traumatic stress disorder, unspecified: Secondary | ICD-10-CM | POA: Diagnosis not present

## 2021-07-18 DIAGNOSIS — F4310 Post-traumatic stress disorder, unspecified: Secondary | ICD-10-CM | POA: Diagnosis not present

## 2021-07-20 DIAGNOSIS — F4310 Post-traumatic stress disorder, unspecified: Secondary | ICD-10-CM | POA: Diagnosis not present

## 2021-09-10 DIAGNOSIS — M9905 Segmental and somatic dysfunction of pelvic region: Secondary | ICD-10-CM | POA: Diagnosis not present

## 2021-09-10 DIAGNOSIS — M9903 Segmental and somatic dysfunction of lumbar region: Secondary | ICD-10-CM | POA: Diagnosis not present

## 2021-09-10 DIAGNOSIS — M9901 Segmental and somatic dysfunction of cervical region: Secondary | ICD-10-CM | POA: Diagnosis not present

## 2021-09-10 DIAGNOSIS — M5451 Vertebrogenic low back pain: Secondary | ICD-10-CM | POA: Diagnosis not present

## 2021-09-10 DIAGNOSIS — M9902 Segmental and somatic dysfunction of thoracic region: Secondary | ICD-10-CM | POA: Diagnosis not present

## 2021-09-11 DIAGNOSIS — M9906 Segmental and somatic dysfunction of lower extremity: Secondary | ICD-10-CM | POA: Diagnosis not present

## 2021-09-11 DIAGNOSIS — M9903 Segmental and somatic dysfunction of lumbar region: Secondary | ICD-10-CM | POA: Diagnosis not present

## 2021-09-11 DIAGNOSIS — M5451 Vertebrogenic low back pain: Secondary | ICD-10-CM | POA: Diagnosis not present

## 2021-09-11 DIAGNOSIS — M9905 Segmental and somatic dysfunction of pelvic region: Secondary | ICD-10-CM | POA: Diagnosis not present

## 2021-09-11 DIAGNOSIS — M9902 Segmental and somatic dysfunction of thoracic region: Secondary | ICD-10-CM | POA: Diagnosis not present

## 2021-09-11 DIAGNOSIS — M9901 Segmental and somatic dysfunction of cervical region: Secondary | ICD-10-CM | POA: Diagnosis not present

## 2021-09-19 DIAGNOSIS — M79672 Pain in left foot: Secondary | ICD-10-CM | POA: Diagnosis not present

## 2021-09-20 DIAGNOSIS — F341 Dysthymic disorder: Secondary | ICD-10-CM | POA: Diagnosis not present

## 2021-09-20 DIAGNOSIS — F4321 Adjustment disorder with depressed mood: Secondary | ICD-10-CM | POA: Diagnosis not present

## 2021-09-20 DIAGNOSIS — F900 Attention-deficit hyperactivity disorder, predominantly inattentive type: Secondary | ICD-10-CM | POA: Diagnosis not present

## 2021-09-28 DIAGNOSIS — M79672 Pain in left foot: Secondary | ICD-10-CM | POA: Diagnosis not present

## 2021-09-28 DIAGNOSIS — S92415A Nondisplaced fracture of proximal phalanx of left great toe, initial encounter for closed fracture: Secondary | ICD-10-CM | POA: Diagnosis not present

## 2021-10-19 DIAGNOSIS — S92415A Nondisplaced fracture of proximal phalanx of left great toe, initial encounter for closed fracture: Secondary | ICD-10-CM | POA: Diagnosis not present

## 2021-11-21 DIAGNOSIS — S92912A Unspecified fracture of left toe(s), initial encounter for closed fracture: Secondary | ICD-10-CM | POA: Diagnosis not present

## 2021-11-21 DIAGNOSIS — M79672 Pain in left foot: Secondary | ICD-10-CM | POA: Diagnosis not present

## 2021-11-28 DIAGNOSIS — F331 Major depressive disorder, recurrent, moderate: Secondary | ICD-10-CM | POA: Diagnosis not present

## 2021-11-28 DIAGNOSIS — F4321 Adjustment disorder with depressed mood: Secondary | ICD-10-CM | POA: Diagnosis not present

## 2021-11-28 DIAGNOSIS — F5101 Primary insomnia: Secondary | ICD-10-CM | POA: Diagnosis not present

## 2021-11-28 DIAGNOSIS — F410 Panic disorder [episodic paroxysmal anxiety] without agoraphobia: Secondary | ICD-10-CM | POA: Diagnosis not present

## 2022-02-14 DIAGNOSIS — Z1231 Encounter for screening mammogram for malignant neoplasm of breast: Secondary | ICD-10-CM | POA: Diagnosis not present

## 2022-03-06 DIAGNOSIS — H0102B Squamous blepharitis left eye, upper and lower eyelids: Secondary | ICD-10-CM | POA: Diagnosis not present

## 2022-03-06 DIAGNOSIS — H04123 Dry eye syndrome of bilateral lacrimal glands: Secondary | ICD-10-CM | POA: Diagnosis not present

## 2022-03-06 DIAGNOSIS — H0102A Squamous blepharitis right eye, upper and lower eyelids: Secondary | ICD-10-CM | POA: Diagnosis not present

## 2022-03-20 DIAGNOSIS — F332 Major depressive disorder, recurrent severe without psychotic features: Secondary | ICD-10-CM | POA: Diagnosis not present

## 2022-03-27 DIAGNOSIS — F332 Major depressive disorder, recurrent severe without psychotic features: Secondary | ICD-10-CM | POA: Diagnosis not present

## 2022-03-28 DIAGNOSIS — F332 Major depressive disorder, recurrent severe without psychotic features: Secondary | ICD-10-CM | POA: Diagnosis not present

## 2022-04-09 DIAGNOSIS — F332 Major depressive disorder, recurrent severe without psychotic features: Secondary | ICD-10-CM | POA: Diagnosis not present

## 2022-04-11 DIAGNOSIS — F332 Major depressive disorder, recurrent severe without psychotic features: Secondary | ICD-10-CM | POA: Diagnosis not present

## 2022-04-18 DIAGNOSIS — H04123 Dry eye syndrome of bilateral lacrimal glands: Secondary | ICD-10-CM | POA: Diagnosis not present

## 2022-04-18 DIAGNOSIS — H0102B Squamous blepharitis left eye, upper and lower eyelids: Secondary | ICD-10-CM | POA: Diagnosis not present

## 2022-04-18 DIAGNOSIS — H02831 Dermatochalasis of right upper eyelid: Secondary | ICD-10-CM | POA: Diagnosis not present

## 2022-04-18 DIAGNOSIS — H02834 Dermatochalasis of left upper eyelid: Secondary | ICD-10-CM | POA: Diagnosis not present

## 2022-05-09 DIAGNOSIS — F332 Major depressive disorder, recurrent severe without psychotic features: Secondary | ICD-10-CM | POA: Diagnosis not present

## 2022-05-30 DIAGNOSIS — F332 Major depressive disorder, recurrent severe without psychotic features: Secondary | ICD-10-CM | POA: Diagnosis not present

## 2022-06-20 DIAGNOSIS — H02831 Dermatochalasis of right upper eyelid: Secondary | ICD-10-CM | POA: Diagnosis not present

## 2022-06-20 DIAGNOSIS — H0102A Squamous blepharitis right eye, upper and lower eyelids: Secondary | ICD-10-CM | POA: Diagnosis not present

## 2022-06-20 DIAGNOSIS — H0102B Squamous blepharitis left eye, upper and lower eyelids: Secondary | ICD-10-CM | POA: Diagnosis not present

## 2022-06-20 DIAGNOSIS — H02834 Dermatochalasis of left upper eyelid: Secondary | ICD-10-CM | POA: Diagnosis not present

## 2022-06-25 DIAGNOSIS — F332 Major depressive disorder, recurrent severe without psychotic features: Secondary | ICD-10-CM | POA: Diagnosis not present

## 2022-07-01 ENCOUNTER — Encounter: Payer: Self-pay | Admitting: Gastroenterology

## 2022-07-02 DIAGNOSIS — L718 Other rosacea: Secondary | ICD-10-CM | POA: Diagnosis not present

## 2022-07-09 ENCOUNTER — Ambulatory Visit (AMBULATORY_SURGERY_CENTER): Payer: BC Managed Care – PPO

## 2022-07-09 VITALS — Ht 66.5 in | Wt 140.0 lb

## 2022-07-09 DIAGNOSIS — Z1211 Encounter for screening for malignant neoplasm of colon: Secondary | ICD-10-CM

## 2022-07-09 MED ORDER — NA SULFATE-K SULFATE-MG SULF 17.5-3.13-1.6 GM/177ML PO SOLN: 1.0000 | Freq: Once | ORAL | 0 refills | Status: AC

## 2022-07-09 NOTE — Progress Notes (Signed)
Pre visit completed via phone call; Patient verified name, DOB, and address;  No egg or soy allergy known to patient;  No issues known to pt with past sedation with any surgeries or procedures; Patient denies ever being told they had issues or difficulty with intubation;  No FH of Malignant Hyperthermia; Pt is not on diet pills; Pt is not on home 02;  Pt is not on blood thinners;  Pt denies issues with constipation;   No A fib or A flutter; Have any cardiac testing pending--NO Pt instructed to use Singlecare[employer]com or GoodRx for a price reduction on prep   Insurance verified during PV appt=BCBS  Patient's chart reviewed by Cathlyn Parsons CNRA prior to previsit and patient appropriate for the [facility].  Previsit completed and red dot placed by patient's name on their procedure day (on provider's schedule).    [facility] GoodRx coupon and instructions printed and mailed to patient per her request;

## 2022-07-15 ENCOUNTER — Encounter: Payer: Self-pay | Admitting: Gastroenterology

## 2022-07-18 DIAGNOSIS — H04123 Dry eye syndrome of bilateral lacrimal glands: Secondary | ICD-10-CM | POA: Diagnosis not present

## 2022-07-23 ENCOUNTER — Ambulatory Visit (AMBULATORY_SURGERY_CENTER): Payer: BC Managed Care – PPO | Admitting: Gastroenterology

## 2022-07-23 ENCOUNTER — Encounter: Payer: Self-pay | Admitting: Gastroenterology

## 2022-07-23 VITALS — BP 114/75 | HR 63 | Temp 97.1°F | Resp 16 | Ht 66.0 in | Wt 140.0 lb

## 2022-07-23 DIAGNOSIS — D123 Benign neoplasm of transverse colon: Secondary | ICD-10-CM

## 2022-07-23 DIAGNOSIS — Z1211 Encounter for screening for malignant neoplasm of colon: Secondary | ICD-10-CM

## 2022-07-23 MED ORDER — SODIUM CHLORIDE 0.9 % IV SOLN: 500.0000 mL | Freq: Once | INTRAVENOUS | Status: DC

## 2022-07-23 NOTE — Progress Notes (Signed)
Gastroenterology History and Physical   Primary Care Physician:  Darrow Bussing, MD   Reason for Procedure:   Colon cancer screening  Plan:    Screening colonoscopy     HPI: Melissa Lucas is a 57 y.o. female undergoing initial average risk screening colonoscopy.  She has no family history of colon cancer and no chronic GI symptoms.    Past Medical History:  Diagnosis Date   Anxiety    on meds   Depression    on meds    Past Surgical History:  Procedure Laterality Date   FOREARM FRACTURE SURGERY  1995   LAPAROSCOPIC APPENDECTOMY N/A 05/31/2020   Procedure: APPENDECTOMY LAPAROSCOPIC;  Surgeon: Andria Meuse, MD;  Location: [facility] OR;  Service: General;  Laterality: N/A;   TONSILLECTOMY     WISDOM TOOTH EXTRACTION      Prior to Admission medications   Medication Sig Start Date End Date Taking? Authorizing Provider  ALPRAZolam Prudy Feeler) 1 MG tablet Take 1 mg by mouth 2 (two) times daily as needed for anxiety.  10/01/17  Yes [provider]  Dextromethorphan-buPROPion ER (AUVELITY) 45-105 MG TBCR Take 1 tablet by mouth in the morning and at bedtime.   Yes [provider]  traZODone (DESYREL) 100 MG tablet Take 100 mg by mouth at bedtime. 05/28/20  Yes [provider]  amphetamine-dextroamphetamine (ADDERALL) 30 MG tablet Take 30 mg by mouth 2 (two) times daily as needed. 10/03/17   [provider]  doxycycline (ADOXA) 50 MG tablet Take 50 mg by mouth 2 (two) times daily. 07/04/22   [provider]  SPRAVATO, 84 MG DOSE, 28 MG/DEVICE SOPK Place 1 spray into both nostrils daily at 6 (six) AM. 06/21/22   [provider]    Current Outpatient Medications  Medication Sig Dispense Refill   ALPRAZolam (XANAX) 1 MG tablet Take 1 mg by mouth 2 (two) times daily as needed for anxiety.   1   Dextromethorphan-buPROPion ER (AUVELITY) 45-105 MG TBCR Take 1 tablet by mouth in the morning and at bedtime.     traZODone (DESYREL) 100 MG  tablet Take 100 mg by mouth at bedtime.     amphetamine-dextroamphetamine (ADDERALL) 30 MG tablet Take 30 mg by mouth 2 (two) times daily as needed.  0   doxycycline (ADOXA) 50 MG tablet Take 50 mg by mouth 2 (two) times daily.     SPRAVATO, 84 MG DOSE, 28 MG/DEVICE SOPK Place 1 spray into both nostrils daily at 6 (six) AM.     Current Facility-Administered Medications  Medication Dose Route Frequency Provider Last Rate Last Admin   0.9 %  sodium chloride infusion  500 mL Intravenous Once Jenel Lucks, MD        Allergies as of 07/23/2022   (No Known Allergies)    Family History  Problem Relation Age of Onset   Diabetes Father    CAD Father    Heart disease Father    Heart attack Maternal Grandfather    Stroke Maternal Grandfather    Lung disease Neg Hx    Colon polyps Neg Hx    Colon cancer Neg Hx    Esophageal cancer Neg Hx    Rectal cancer Neg Hx    Stomach cancer Neg Hx     Social History   Socioeconomic History   Marital status: Single    Spouse name: Not on file   Number of children: Not on file   Years of education: Not on file  Highest education level: Not on file  Occupational History   Not on file  Tobacco Use   Smoking status: Never   Smokeless tobacco: Never  Vaping Use   Vaping Use: Some days  Substance and Sexual Activity   Alcohol use: Not Currently    Alcohol/week: 0.0 - 1.0 standard drinks of alcohol    Comment: occasional drink   Drug use: Never   Sexual activity: Not Currently  Other Topics Concern   Not on file  Social History Narrative   Not on file   Social Determinants of Health   Financial Resource Strain: Not on file  Food Insecurity: Not on file  Transportation Needs: Not on file  Physical Activity: Not on file  Stress: Not on file  Social Connections: Not on file  Intimate Partner Violence: Not on file    Review of Systems:  All other review of systems negative except as mentioned in the HPI.  Physical  Exam: Vital signs BP 127/69   Pulse 79   Temp (!) 97.1 F (36.2 C)   Ht 5\' 6"  (1.676 m)   Wt 140 lb (63.5 kg)   SpO2 98%   BMI 22.60 kg/m   General:   Alert,  Well-developed, well-nourished, pleasant and cooperative in NAD Airway:  Mallampati 1 Lungs:  Clear throughout to auscultation.   Heart:  Regular rate and rhythm; no murmurs, clicks, rubs,  or gallops. Abdomen:  Soft, nontender and nondistended. Normal bowel sounds.   Neuro/Psych:  Normal mood and affect. A and O x 3   Melissa Lucas E. Tomasa Rand, MD [facility]

## 2022-07-23 NOTE — Progress Notes (Signed)
Pt's states no medical or surgical changes since previsit or office visit. 

## 2022-07-23 NOTE — Progress Notes (Signed)
Called to room to assist during endoscopic procedure.  Patient ID and intended procedure confirmed with present staff. Received instructions for my participation in the procedure from the performing physician.  

## 2022-07-23 NOTE — Progress Notes (Signed)
Uneventful anesthetic. Report to pacu rn. Vss. Care resumed by rn. 

## 2022-07-23 NOTE — Op Note (Signed)
[facility] Patient Name: Melissa Lucas Procedure Date: 07/23/2022 8:48 AM MRN: [id number] Endoscopist: Lorin Picket E. Tomasa Rand , MD, [id number] Age: 57 Referring MD:  Date of Birth: 08/17/1965 Gender: Female Account #: [id number] Procedure:                Colonoscopy Indications:              Screening for colorectal malignant neoplasm, This                            is the patient's first colonoscopy Medicines:                Monitored Anesthesia Care Procedure:                Pre-Anesthesia Assessment:                           - Prior to the procedure, a History and Physical                            was performed, and patient medications and                            allergies were reviewed. The patient's tolerance of                            previous anesthesia was also reviewed. The risks                            and benefits of the procedure and the sedation                            options and risks were discussed with the patient.                            All questions were answered, and informed consent                            was obtained. Prior Anticoagulants: The patient has                            taken no anticoagulant or antiplatelet agents. ASA                            Grade Assessment: I - A normal, healthy patient.                            After reviewing the risks and benefits, the patient                            was deemed in satisfactory condition to undergo the                            procedure.  After obtaining informed consent, the colonoscope                            was passed under direct vision. Throughout the                            procedure, the patient's blood pressure, pulse, and                            oxygen saturations were monitored continuously. The                            Olympus CF-HQ190L SN [id number] was introduced through                            the anus and advanced to the  the terminal ileum,                            with identification of the appendiceal orifice and                            IC valve. The colonoscopy was performed without                            difficulty. The patient tolerated the procedure                            well. The quality of the bowel preparation was                            adequate. The terminal ileum, ileocecal valve,                            appendiceal orifice, and rectum were photographed.                            The bowel preparation used was SUPREP via split                            dose instruction. Scope In: 9:02:01 AM Scope Out: 9:19:19 AM Scope Withdrawal Time: 0 hours 13 minutes 0 seconds  Total Procedure Duration: 0 hours 17 minutes 18 seconds  Findings:                 The perianal and digital rectal examinations were                            normal. Pertinent negatives include normal                            sphincter tone and no palpable rectal lesions.                           A 5 mm polyp was found in the splenic flexure. The  polyp was sessile. The polyp was removed with a                            cold snare. Resection and retrieval were complete.                            Estimated blood loss was minimal.                           Many large-mouthed and small-mouthed diverticula                            were found in the sigmoid colon and descending                            colon. There was no evidence of diverticular                            bleeding.                           The exam was otherwise normal throughout the                            examined colon.                           The terminal ileum appeared normal.                           The retroflexed view of the distal rectum and anal                            verge was normal and showed no anal or rectal                            abnormalities. Complications:            No  immediate complications. Estimated Blood Loss:     Estimated blood loss was minimal. Impression:               - One 5 mm polyp at the splenic flexure, removed                            with a cold snare. Resected and retrieved.                           - Moderate diverticulosis in the sigmoid colon and                            in the descending colon. There was no evidence of                            diverticular bleeding.                           -  The examined portion of the ileum was normal.                           - The distal rectum and anal verge are normal on                            retroflexion view. Recommendation:           - Patient has a contact number available for                            emergencies. The signs and symptoms of potential                            delayed complications were discussed with the                            patient. Return to normal activities tomorrow.                            Written discharge instructions were provided to the                            patient.                           - Resume previous diet.                           - Continue present medications.                           - Await pathology results.                           - Repeat colonoscopy (date not yet determined) for                            surveillance based on pathology results.                           - Recommend high fiber diet/daily fiber                            supplementation to reduce risk of diverticular                            complications. Kennie Karapetian E. Tomasa Rand, MD 07/23/2022 9:24:07 AM This report has been signed electronically.

## 2022-07-23 NOTE — Patient Instructions (Signed)
Handout on polyps, diverticulosis, and high-fiber diet given to patient. Await pathology results. Resume previous diet and continue present medications. Repeat colonoscopy for surveillance will be determined based off of pathology results.   YOU HAD AN ENDOSCOPIC PROCEDURE TODAY AT THE [facility]:   Refer to the procedure report that was given to you for any specific questions about what was found during the examination.  If the procedure report does not answer your questions, please call your gastroenterologist to clarify.  If you requested that your care partner not be given the details of your procedure findings, then the procedure report has been included in a sealed envelope for you to review at your convenience later.  YOU SHOULD EXPECT: Some feelings of bloating in the abdomen. Passage of more gas than usual.  Walking can help get rid of the air that was put into your GI tract during the procedure and reduce the bloating. If you had a lower endoscopy (such as a colonoscopy or flexible sigmoidoscopy) you may notice spotting of blood in your stool or on the toilet paper. If you underwent a bowel prep for your procedure, you may not have a normal bowel movement for a few days.  Please Note:  You might notice some irritation and congestion in your nose or some drainage.  This is from the oxygen used during your procedure.  There is no need for concern and it should clear up in a day or so.  SYMPTOMS TO REPORT IMMEDIATELY:  Following lower endoscopy (colonoscopy or flexible sigmoidoscopy):  Excessive amounts of blood in the stool  Significant tenderness or worsening of abdominal pains  Swelling of the abdomen that is new, acute  Fever of 100F or higher  For urgent or emergent issues, a gastroenterologist can be reached at any hour by calling [phone]. Do not use MyChart messaging for urgent concerns.    DIET:  We do recommend a small meal at first, but then you may  proceed to your regular diet.  Drink plenty of fluids but you should avoid alcoholic beverages for 24 hours.  ACTIVITY:  You should plan to take it easy for the rest of today and you should NOT DRIVE or use heavy machinery until tomorrow (because of the sedation medicines used during the test).    FOLLOW UP: Our staff will call the number listed on your records the next business day following your procedure.  We will call around 7:15- 8:00 am to check on you and address any questions or concerns that you may have regarding the information given to you following your procedure. If we do not reach you, we will leave a message.     If any biopsies were taken you will be contacted by phone or by letter within the next 1-3 weeks.  Please call us at [phone] if you have not heard about the biopsies in 3 weeks.    SIGNATURES/CONFIDENTIALITY: You and/or your care partner have signed paperwork which will be entered into your electronic medical record.  These signatures attest to the fact that that the information above on your After Visit Summary has been reviewed and is understood.  Full responsibility of the confidentiality of this discharge information lies with you and/or your care-partner.

## 2022-07-24 ENCOUNTER — Telehealth: Payer: Self-pay

## 2022-07-24 NOTE — Telephone Encounter (Signed)
No answer, left message to call if having any issues or concerns, B.Dishon Kehoe RN 

## 2022-07-29 ENCOUNTER — Encounter: Payer: Self-pay | Admitting: Gastroenterology

## 2022-07-29 DIAGNOSIS — F332 Major depressive disorder, recurrent severe without psychotic features: Secondary | ICD-10-CM | POA: Diagnosis not present

## 2022-08-22 DIAGNOSIS — F410 Panic disorder [episodic paroxysmal anxiety] without agoraphobia: Secondary | ICD-10-CM | POA: Diagnosis not present

## 2022-08-22 DIAGNOSIS — F5101 Primary insomnia: Secondary | ICD-10-CM | POA: Diagnosis not present

## 2022-08-22 DIAGNOSIS — F900 Attention-deficit hyperactivity disorder, predominantly inattentive type: Secondary | ICD-10-CM | POA: Diagnosis not present

## 2022-08-22 DIAGNOSIS — F332 Major depressive disorder, recurrent severe without psychotic features: Secondary | ICD-10-CM | POA: Diagnosis not present

## 2022-09-27 DIAGNOSIS — K121 Other forms of stomatitis: Secondary | ICD-10-CM | POA: Diagnosis not present

## 2022-09-27 DIAGNOSIS — K146 Glossodynia: Secondary | ICD-10-CM | POA: Diagnosis not present

## 2022-10-01 ENCOUNTER — Encounter (HOSPITAL_BASED_OUTPATIENT_CLINIC_OR_DEPARTMENT_OTHER): Payer: Self-pay | Admitting: Family Medicine

## 2022-10-01 ENCOUNTER — Other Ambulatory Visit (HOSPITAL_BASED_OUTPATIENT_CLINIC_OR_DEPARTMENT_OTHER): Payer: Self-pay

## 2022-10-01 ENCOUNTER — Encounter (HOSPITAL_BASED_OUTPATIENT_CLINIC_OR_DEPARTMENT_OTHER): Payer: Self-pay | Admitting: *Deleted

## 2022-10-01 ENCOUNTER — Ambulatory Visit (HOSPITAL_BASED_OUTPATIENT_CLINIC_OR_DEPARTMENT_OTHER): Payer: BC Managed Care – PPO | Admitting: Family Medicine

## 2022-10-01 VITALS — BP 109/80 | HR 82 | Ht 67.0 in | Wt 149.0 lb

## 2022-10-01 DIAGNOSIS — F411 Generalized anxiety disorder: Secondary | ICD-10-CM | POA: Diagnosis not present

## 2022-10-01 DIAGNOSIS — Z7689 Persons encountering health services in other specified circumstances: Secondary | ICD-10-CM

## 2022-10-01 DIAGNOSIS — B370 Candidal stomatitis: Secondary | ICD-10-CM | POA: Insufficient documentation

## 2022-10-01 DIAGNOSIS — F4310 Post-traumatic stress disorder, unspecified: Secondary | ICD-10-CM | POA: Diagnosis not present

## 2022-10-01 DIAGNOSIS — F331 Major depressive disorder, recurrent, moderate: Secondary | ICD-10-CM | POA: Diagnosis not present

## 2022-10-01 MED ORDER — LIDOCAINE VISCOUS HCL 2 % MT SOLN
5.0000 mL | Freq: Four times a day (QID) | OROMUCOSAL | 1 refills | Status: AC | PRN
Start: 2022-10-01 — End: ?
  Filled 2022-10-01: qty 180, 9d supply, fill #0
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
## 2022-10-02 ENCOUNTER — Other Ambulatory Visit (HOSPITAL_BASED_OUTPATIENT_CLINIC_OR_DEPARTMENT_OTHER): Payer: BC Managed Care – PPO

## 2022-10-02 ENCOUNTER — Other Ambulatory Visit (HOSPITAL_BASED_OUTPATIENT_CLINIC_OR_DEPARTMENT_OTHER): Payer: Self-pay | Admitting: Family Medicine

## 2022-10-02 ENCOUNTER — Other Ambulatory Visit (HOSPITAL_BASED_OUTPATIENT_CLINIC_OR_DEPARTMENT_OTHER): Payer: Self-pay

## 2022-10-02 ENCOUNTER — Encounter (HOSPITAL_BASED_OUTPATIENT_CLINIC_OR_DEPARTMENT_OTHER): Payer: Self-pay | Admitting: Family Medicine

## 2022-10-02 DIAGNOSIS — Z7689 Persons encountering health services in other specified circumstances: Secondary | ICD-10-CM | POA: Diagnosis not present

## 2022-10-03 LAB — CBC WITH DIFFERENTIAL/PLATELET
Basophils Absolute: 0 10*3/uL (ref 0.0–0.2)
Basos: 1 %
EOS (ABSOLUTE): 0.1 10*3/uL (ref 0.0–0.4)
Eos: 1 %
Hematocrit: 42.5 % (ref 34.0–46.6)
Hemoglobin: 13.8 g/dL (ref 11.1–15.9)
Immature Grans (Abs): 0 10*3/uL (ref 0.0–0.1)
Immature Granulocytes: 0 %
Lymphocytes Absolute: 2.1 10*3/uL (ref 0.7–3.1)
Lymphs: 34 %
MCH: 28.4 pg (ref 26.6–33.0)
MCHC: 32.5 g/dL (ref 31.5–35.7)
MCV: 87 fL (ref 79–97)
Monocytes Absolute: 0.7 10*3/uL (ref 0.1–0.9)
Monocytes: 12 %
Neutrophils Absolute: 3.3 10*3/uL (ref 1.4–7.0)
Neutrophils: 52 %
Platelets: 375 10*3/uL (ref 150–450)
RBC: 4.86 x10E6/uL (ref 3.77–5.28)
RDW: 13.5 % (ref 11.7–15.4)
WBC: 6.2 10*3/uL (ref 3.4–10.8)

## 2022-10-03 LAB — COMPREHENSIVE METABOLIC PANEL
ALT: 20 IU/L (ref 0–32)
AST: 16 IU/L (ref 0–40)
Albumin: 4.2 g/dL (ref 3.8–4.9)
Alkaline Phosphatase: 74 IU/L (ref 44–121)
BUN/Creatinine Ratio: 23 (ref 9–23)
BUN: 15 mg/dL (ref 6–24)
Bilirubin Total: 0.4 mg/dL (ref 0.0–1.2)
CO2: 24 mmol/L (ref 20–29)
Calcium: 9.7 mg/dL (ref 8.7–10.2)
Chloride: 101 mmol/L (ref 96–106)
Creatinine, Ser: 0.66 mg/dL (ref 0.57–1.00)
Globulin, Total: 2.2 g/dL (ref 1.5–4.5)
Glucose: 101 mg/dL — ABNORMAL HIGH (ref 70–99)
Potassium: 4.7 mmol/L (ref 3.5–5.2)
Sodium: 139 mmol/L (ref 134–144)
Total Protein: 6.4 g/dL (ref 6.0–8.5)
eGFR: 102 mL/min/{1.73_m2} (ref >59)

## 2022-10-03 LAB — HEMOGLOBIN A1C
Est. average glucose Bld gHb Est-mCnc: 114 mg/dL
Hgb A1c MFr Bld: 5.6 % (ref 4.8–5.6)

## 2022-10-03 LAB — TSH: TSH: 2.38 u[IU]/mL (ref 0.450–4.500)

## 2022-10-03 LAB — LIPID PANEL
Chol/HDL Ratio: 3.4 ratio (ref 0.0–4.4)
Cholesterol, Total: 259 mg/dL — ABNORMAL HIGH (ref 100–199)
HDL: 77 mg/dL (ref >39)
LDL Chol Calc (NIH): 163 mg/dL — ABNORMAL HIGH (ref 0–99)
Triglycerides: 111 mg/dL (ref 0–149)
VLDL Cholesterol Cal: 19 mg/dL (ref 5–40)

## 2022-10-04 ENCOUNTER — Other Ambulatory Visit (HOSPITAL_BASED_OUTPATIENT_CLINIC_OR_DEPARTMENT_OTHER): Payer: Self-pay

## 2022-10-04 ENCOUNTER — Telehealth (HOSPITAL_BASED_OUTPATIENT_CLINIC_OR_DEPARTMENT_OTHER): Payer: Self-pay | Admitting: Family Medicine

## 2022-10-04 ENCOUNTER — Other Ambulatory Visit (HOSPITAL_BASED_OUTPATIENT_CLINIC_OR_DEPARTMENT_OTHER): Payer: Self-pay | Admitting: Family Medicine

## 2022-10-04 MED ORDER — FLUCONAZOLE 100 MG PO TABS: ORAL_TABLET | ORAL | 0 refills | Status: AC

## 2022-10-04 NOTE — Telephone Encounter (Signed)
Pt LVM regarding her mouth pain   Called pt back, and advised that the labs are back, however Jon Gills will need to review the records, and notate the file. I also advised that if the labs came back and was an EMERGENCY   that someone would call her back

## 2022-10-04 NOTE — Telephone Encounter (Signed)
Patient states she has finished the steroid taper and feels like the mouth sores are coming back in full force. She is wondering what else she can do for the fungal infection. Mouthwash is not helping much at all, says her face is swelling up, she cannot eat, and she is in a lot of pain. Is using Advil as directed also. She would like to know what she should avoid eating and what she should be eating

## 2022-10-04 NOTE — Telephone Encounter (Signed)
Patient is agreeable to starting an anti fungal medication

## 2022-10-07 ENCOUNTER — Ambulatory Visit (INDEPENDENT_AMBULATORY_CARE_PROVIDER_SITE_OTHER): Payer: BC Managed Care – PPO | Admitting: Family Medicine

## 2022-10-07 ENCOUNTER — Encounter (HOSPITAL_BASED_OUTPATIENT_CLINIC_OR_DEPARTMENT_OTHER): Payer: Self-pay | Admitting: Family Medicine

## 2022-10-07 VITALS — BP 118/77 | HR 70 | Ht 67.0 in | Wt 144.7 lb

## 2022-10-07 DIAGNOSIS — K1370 Unspecified lesions of oral mucosa: Secondary | ICD-10-CM

## 2022-10-07 DIAGNOSIS — B370 Candidal stomatitis: Secondary | ICD-10-CM

## 2022-10-07 MED ORDER — FAMOTIDINE 20 MG PO TABS: 20.0000 mg | ORAL_TABLET | Freq: Two times a day (BID) | ORAL | 1 refills | Status: DC

## 2022-10-07 NOTE — Patient Instructions (Signed)
Pick up Oragel at the pharmacy and see if this brings relief with topical treatment

## 2022-10-07 NOTE — Telephone Encounter (Signed)
Called and spoke with pt. Pt stated she was able to get the diflucan from the pharmacy but she states things are beginning to get worse. Pt said that her lip is now looking almost like she has received lip injections and is swollen and also states that there are bumps all around the bottom of her lip.  Pt said she is taking ibuprofen to see if it would help with the pain which she said helps some for the pain. States that when she uses the mouthwash, it will help right away when she uses it but as soon as she is finished with the mouthwash, the pain comes back.  Pt wants to know if there is anything else we could recommend to help out with the pain as it hurts when she does a lot of talking and states even when she uses a straw to drink from, she has a lot of pain. Pt wants to know if there is someone that she might be able to be referred to or what we might be able to recommend for her.  Alexis, please advise.

## 2022-10-07 NOTE — Telephone Encounter (Signed)
Called and spoke with pt letting her know the message per Jon Gills and have scheduled her an appt at 2:10 today. Nothing further needed.

## 2022-10-07 NOTE — Progress Notes (Unsigned)
Subjective:   Melissa Lucas [birth year] 10/07/2022  Chief Complaint  Patient presents with   Oral Pain    Pt is still having problems with sores in her mouth and also on her lips that is painful.    HPI: Melissa Lucas presents today for re-assessment and management of chronic medical conditions.   Patient placed on Diflucan therapy as well starting on 10/04/22.    The following portions of the patient's history were reviewed and updated as appropriate: past medical history, past surgical history, family history, social history, allergies, medications, and problem list.   Patient Active Problem List   Diagnosis Date Noted   Generalized anxiety disorder 10/01/2022   Moderate episode of recurrent major depressive disorder (HCC) 10/01/2022   Oral candidiasis 10/01/2022   Pain in left foot 09/19/2021   PTSD (post-traumatic stress disorder) 05/10/2021   S/P laparoscopic appendectomy 06/01/2020   Appendicitis 05/31/2020   Closed fracture of lateral malleolus 04/14/2019   Closed fracture of fifth metatarsal bone 09/28/2018   Right lower lobe pneumonia 10/22/2017   Anxiety 10/22/2017   Elevated AST (SGOT) 10/22/2017   Elevated alanine aminotransferase (ALT) level 10/22/2017   Hypokalemia 10/22/2017   Past Medical History:  Diagnosis Date   Anxiety    on meds   Depression    on meds   Past Surgical History:  Procedure Laterality Date   BREAST ENHANCEMENT SURGERY     FOREARM FRACTURE SURGERY  1995   LAPAROSCOPIC APPENDECTOMY N/A 05/31/2020   Procedure: APPENDECTOMY LAPAROSCOPIC;  Surgeon: Andria Meuse, MD;  Location: [facility] OR;  Service: General;  Laterality: N/A;   TONSILLECTOMY     WISDOM TOOTH EXTRACTION     Family History  Problem Relation Age of Onset   Diabetes Father    CAD Father    Heart disease Father    Heart attack Maternal Grandfather    Stroke Maternal Grandfather    Lung disease Neg Hx    Colon polyps Neg Hx    Colon cancer Neg Hx    Esophageal  cancer Neg Hx    Rectal cancer Neg Hx    Stomach cancer Neg Hx    Outpatient Medications Prior to Visit  Medication Sig Dispense Refill   ALPRAZolam (XANAX) 1 MG tablet Take 1 mg by mouth 2 (two) times daily as needed for anxiety.   1   amphetamine-dextroamphetamine (ADDERALL) 30 MG tablet Take 30 mg by mouth 2 (two) times daily as needed.  0   Dextromethorphan-buPROPion ER (AUVELITY) 45-105 MG TBCR Take 1 tablet by mouth in the morning and at bedtime.     fluconazole (DIFLUCAN) 100 MG tablet Take 2 tablets (200mg  total) by mouth on Day 1, then 1 tablet (100mg  total) by mouth each day for 4 days. 6 tablet 0   magic mouthwash (nystatin, lidocaine, diphenhydrAMINE, alum & mag hydroxide) suspension Swish and spit 5 mLs 4 (four) times daily as needed for mouth pain. 180 mL 1   SPRAVATO, 84 MG DOSE, 28 MG/DEVICE SOPK Place 1 spray into both nostrils daily at 6 (six) AM.     traZODone (DESYREL) 100 MG tablet Take 100 mg by mouth at bedtime.     predniSONE (STERAPRED UNI-PAK 21 TAB) 5 MG (21) TBPK tablet Take by mouth.     No facility-administered medications prior to visit.   No Known Allergies   ROS: A complete ROS was performed with pertinent positives/negatives noted in the HPI. The remainder of the ROS are negative.  Objective:   Today's Vitals   10/07/22 1624  BP: 118/77  Pulse: 70  SpO2: 100%  Weight: 144 lb 11.2 oz (65.6 kg)  Height: 5\' 7"  (1.702 m)    Physical Exam          GENERAL: Well-appearing, in NAD. Well nourished.  SKIN: Pink, warm and dry. No rash, lesion, ulceration, or ecchymoses.  Head: Normocephalic. NECK: Trachea midline. Full ROM w/o pain or tenderness. No lymphadenopathy.  EARS: Tympanic membranes are intact, translucent without bulging and without drainage. Appropriate landmarks visualized.  EYES: Conjunctiva clear without exudates. EOMI, PERRL, no drainage present.  NOSE: Septum midline w/o deformity. Nares patent, mucosa pink and non-inflamed w/o  drainage. No sinus tenderness.  THROAT: Uvula midline. Oropharynx clear. Tonsils non-inflamed without exudate. Mucous membranes pink and moist.  RESPIRATORY: Chest wall symmetrical. Respirations even and non-labored. Breath sounds clear to auscultation bilaterally.  CARDIAC: S1, S2 present, regular rate and rhythm without murmur or gallops. Peripheral pulses 2+ bilaterally.  MSK: Muscle tone and strength appropriate for age. Joints w/o tenderness, redness, or swelling.  EXTREMITIES: Without clubbing, cyanosis, or edema.  NEUROLOGIC: No motor or sensory deficits. Steady, even gait. C2-C12 intact.  PSYCH/MENTAL STATUS: Alert, oriented x 3. Cooperative, appropriate mood and affect.   Health Maintenance Due  Topic Date Due   Hepatitis C Screening  Never done   DTaP/Tdap/Td (1 - Tdap) Never done   Zoster Vaccines- Shingrix (1 of 2) Never done   PAP SMEAR-Modifier  06/06/2020   COVID-19 Vaccine (1 - 2023-24 season) Never done    No results found for any visits on 10/07/22.  The ASCVD Risk score (Arnett DK, et al., 2019) failed to calculate for the following reasons:   Unable to determine if patient is Non-Hispanic African American     Assessment & Plan:  *** There are no diagnoses linked to this encounter.  No orders of the defined types were placed in this encounter.  Lab Orders  No laboratory test(s) ordered today   No images are attached to the encounter or orders placed in the encounter.  No follow-ups on file.    Patient to reach out to office if new, worrisome, or unresolved symptoms arise or if no improvement in patient's condition. Patient verbalized understanding and is agreeable to treatment plan. All questions answered to patient's satisfaction.   Of note, portions of this note may have been created with voice recognition software Physicist, medical). While this note has been edited for accuracy, occasional wrong-word or 'sound-a-like' substitutions may have occurred due to  the inherent limitations of voice recognition software.  Yolanda Manges, FNP

## 2022-10-08 ENCOUNTER — Encounter (HOSPITAL_BASED_OUTPATIENT_CLINIC_OR_DEPARTMENT_OTHER): Payer: Self-pay | Admitting: Family Medicine

## 2022-10-09 ENCOUNTER — Telehealth (HOSPITAL_BASED_OUTPATIENT_CLINIC_OR_DEPARTMENT_OTHER): Payer: Self-pay | Admitting: Family Medicine

## 2022-10-09 NOTE — Telephone Encounter (Signed)
Please call the pt regarding her mouth --one side is hurting worse, like when she moves her tongue around   Please call the pt

## 2022-10-10 ENCOUNTER — Other Ambulatory Visit (HOSPITAL_BASED_OUTPATIENT_CLINIC_OR_DEPARTMENT_OTHER): Payer: Self-pay | Admitting: Family Medicine

## 2022-10-10 NOTE — Telephone Encounter (Signed)
Discussed with patient over the phone. Dr. Onalee Hua with Dermatology will schedule her for a biopsy on Monday morning. Pt is agreeable.

## 2022-10-14 ENCOUNTER — Ambulatory Visit: Payer: BC Managed Care – PPO | Admitting: Dermatology

## 2022-10-14 NOTE — Progress Notes (Deleted)
Subjective:   Melissa Lucas 03-16-1966  10/15/2022   CC: No chief complaint on file.   HPI: Melissa Lucas is a 57 y.o. female who presents for a routine health maintenance exam.  Labs collected at time of visit.    HEALTH SCREENINGS: - Vision Screening: {Blank single:19197::"pap done","not applicable","up to date","done elsewhere"} - Dental Visits: {Blank single:19197::"pap done","not applicable","up to date","done elsewhere"} - Pap smear: {Blank single:19197::"pap done","not applicable","up to date","done elsewhere"} - Breast Exam: {Blank single:19197::"pap done","not applicable","up to date","done elsewhere"} - STD Screening: {Blank single:19197::"Up to date","Ordered today","Not applicable","Declined","Done elsewhere"} - Mammogram (40+): {Blank single:19197::"Up to date","Ordered today","Not applicable","Refused","Done elsewhere"}  - Colonoscopy (45+): {Blank single:19197::"Up to date","Ordered today","Not applicable","Refused","Done elsewhere"}  - Bone Density (65+ or under 65 with predisposing conditions): {Blank single:19197::"Up to date","Ordered today","Not applicable","Refused","Done elsewhere"}  - Lung CA screening with low-dose CT:  {Blank single:19197::"Up to date","Ordered today","Not applicable","Declined","Done elsewhere"} Adults age 40-80 who are current cigarette smokers or quit within the last 15 years. Must have 20 pack year history.   Depression and Anxiety Screen done today and results listed below:     10/01/2022    2:23 PM  Depression screen PHQ 2/9  Decreased Interest 2  Down, Depressed, Hopeless 1  PHQ - 2 Score 3  Altered sleeping 3  Tired, decreased energy 2  Change in appetite 0  Feeling bad or failure about yourself  3  Trouble concentrating 3  Moving slowly or fidgety/restless 3  Suicidal thoughts 0  PHQ-9 Score 17  Difficult doing work/chores Very difficult      10/01/2022    2:23 PM  GAD 7 : Generalized Anxiety Score  Nervous, Anxious, on  Edge 2  Control/stop worrying 1  Worry too much - different things 1  Trouble relaxing 1  Restless 2  Easily annoyed or irritable 0  Afraid - awful might happen 3  Total GAD 7 Score 10  Anxiety Difficulty Very difficult    IMMUNIZATIONS: - Tdap: Tetanus vaccination status reviewed: {tetanus status:315746}. - HPV: {Blank single:19197::"Up to date","Administered today","Not applicable","Refused","Given elsewhere"} - Influenza: {Blank single:19197::"Up to date","Administered today","Postponed to flu season","Refused","Given elsewhere"} - Pneumovax: {Blank single:19197::"Up to date","Administered today","Not applicable","Refused","Given elsewhere"} - Prevnar 20: {Blank single:19197::"Up to date","Administered today","Not applicable","Refused","Given elsewhere"} - Zostavax (50+): {Blank single:19197::"Up to date","Administered today","Not applicable","Refused","Given elsewhere"}   Past medical history, surgical history, medications, allergies, family history and social history reviewed with patient today and changes made to appropriate areas of the chart.   Past Medical History:  Diagnosis Date   Anxiety    on meds   Depression    on meds    Past Surgical History:  Procedure Laterality Date   BREAST ENHANCEMENT SURGERY     FOREARM FRACTURE SURGERY  1995   LAPAROSCOPIC APPENDECTOMY N/A 05/31/2020   Procedure: APPENDECTOMY LAPAROSCOPIC;  Surgeon: Andria Meuse, MD;  Location: [facility] OR;  Service: General;  Laterality: N/A;   TONSILLECTOMY     WISDOM TOOTH EXTRACTION      Current Outpatient Medications on File Prior to Visit  Medication Sig   ALPRAZolam (XANAX) 1 MG tablet Take 1 mg by mouth 2 (two) times daily as needed for anxiety.    amphetamine-dextroamphetamine (ADDERALL) 30 MG tablet Take 30 mg by mouth 2 (two) times daily as needed.   Dextromethorphan-buPROPion ER (AUVELITY) 45-105 MG TBCR Take 1 tablet by mouth in the morning and at bedtime.   famotidine (PEPCID) 20 MG  tablet Take 1 tablet (20 mg total) by mouth 2 (two) times daily.   fluconazole (  DIFLUCAN) 100 MG tablet Take 2 tablets (200mg  total) by mouth on Day 1, then 1 tablet (100mg  total) by mouth each day for 4 days.   magic mouthwash (nystatin, lidocaine, diphenhydrAMINE, alum & mag hydroxide) suspension Swish and spit 5 mLs 4 (four) times daily as needed for mouth pain.   SPRAVATO, 84 MG DOSE, 28 MG/DEVICE SOPK Place 1 spray into both nostrils daily at 6 (six) AM.   traZODone (DESYREL) 100 MG tablet Take 100 mg by mouth at bedtime.   No current facility-administered medications on file prior to visit.    No Known Allergies   Social History   Socioeconomic History   Marital status: Single    Spouse name: Not on file   Number of children: Not on file   Years of education: Not on file   Highest education level: Not on file  Occupational History   Not on file  Tobacco Use   Smoking status: Every Day    Types: E-cigarettes   Smokeless tobacco: Never  Vaping Use   Vaping status: Some Days  Substance and Sexual Activity   Alcohol use: Not Currently    Alcohol/week: 0.0 - 1.0 standard drinks of alcohol    Comment: occasional drink   Drug use: Never   Sexual activity: Not Currently  Other Topics Concern   Not on file  Social History Narrative   Not on file   Social Determinants of Health   Financial Resource Strain: Low Risk  (05/10/2021)   Received from [facility]   Overall Financial Resource Strain (CARDIA)    Difficulty of Paying Living Expenses: Not hard at all  Food Insecurity: No Food Insecurity (05/10/2021)   Received from [facility]   Hunger Vital Sign    Worried About Running Out of Food in the Last Year: Never true    Ran Out of Food in the Last Year: Never true  Transportation Needs: No Transportation Needs (05/10/2021)   Received from [facility] - Transportation    Lack of Transportation (Medical): No    Lack of Transportation (Non-Medical): No   Physical Activity: Inactive (05/10/2021)   Received from [facility]   Exercise Vital Sign    Days of Exercise per Week: 0 days    Minutes of Exercise per Session: 0 min  Stress: Stress Concern Present (05/10/2021)   Received from [facility] - Occupational Stress Questionnaire    Feeling of Stress : Very much  Social Connections: Unknown (07/16/2021)   Received from [facility]   Social Network    Social Network: Not on file  Recent Concern: Social Connections - Socially Isolated (05/10/2021)   Received from [facility]   Social Connection and Isolation Panel [NHANES]    Frequency of Communication with Friends and Family: Once a week    Frequency of Social Gatherings with Friends and Family: Never    Attends Religious Services: Never    Database administrator or Organizations: Yes    Attends Banker Meetings: Never    Marital Status: Never married  Intimate Partner Violence: Unknown (06/18/2021)   Received from [facility]   HITS    Physically Hurt: Not on file    Insult or Talk Down To: Not on file    Threaten Physical Harm: Not on file    Scream or Curse: Not on file   Social History   Tobacco Use  Smoking Status Every Day  Types: E-cigarettes  Smokeless Tobacco Never   Social History   Substance and Sexual Activity  Alcohol Use Not Currently   Alcohol/week: 0.0 - 1.0 standard drinks of alcohol   Comment: occasional drink    Family History  Problem Relation Age of Onset   Diabetes Father    CAD Father    Heart disease Father    Heart attack Maternal Grandfather    Stroke Maternal Grandfather    Lung disease Neg Hx    Colon polyps Neg Hx    Colon cancer Neg Hx    Esophageal cancer Neg Hx    Rectal cancer Neg Hx    Stomach cancer Neg Hx      ROS: Denies fever, fatigue, unexplained weight loss/gain, chest pain, SHOB, and palpitations. Denies neurological deficits, gastrointestinal or  genitourinary complaints, and skin changes.   Objective:   There were no vitals filed for this visit.  GENERAL APPEARANCE: Well-appearing, in NAD. Well nourished.  SKIN: Pink, warm and dry. Turgor normal. No rash, lesion, ulceration, or ecchymoses. Hair evenly distributed.  HEENT: HEAD: Normocephalic.  EYES: PERRLA. EOMI. Lids intact w/o defect. Sclera white, Conjunctiva pink w/o exudate.  EARS: External ear w/o redness, swelling, masses or lesions. EAC clear. TM's intact, translucent w/o bulging, appropriate landmarks visualized. Appropriate acuity to conversational tones.  NOSE: Septum midline w/o deformity. Nares patent, mucosa pink and non-inflamed w/o drainage. No sinus tenderness.  THROAT: Uvula midline. Oropharynx clear. Tonsils non-inflamed w/o exudate. Oral mucosa pink and moist.  NECK: Supple, Trachea midline. Full ROM w/o pain or tenderness. No lymphadenopathy. Thyroid non-tender w/o enlargement or palpable masses.  BREASTS: Breasts pendulous, symmetrical, and w/o palpable masses. Nipples everted and w/o discharge. No rash or skin retraction. No axillary or supraclavicular lymphadenopathy.  RESPIRATORY: Chest wall symmetrical w/o masses. Respirations even and non-labored. Breath sounds clear to auscultation bilaterally. No wheezes, rales, rhonchi, or crackles. CARDIAC: S1, S2 present, regular rate and rhythm. No gallops, murmurs, rubs, or clicks. PMI w/o lifts, heaves, or thrills. No carotid bruits. Capillary refill <2 seconds. Peripheral pulses 2+ bilaterally. GI: Abdomen soft w/o distention. Normoactive bowel sounds. No palpable masses or tenderness. No guarding or rebound tenderness. Liver and spleen w/o tenderness or enlargement. No CVA tenderness.  GU: External genitalia without erythema, lesions, or masses. No lymphadenopathy. Vaginal mucosa pink and moist without exudate, lesions, or ulcerations. Cervix pink without discharge. Cervical os closed. Uterus and adnexae palpable, not  enlarged, and w/o tenderness. No palpable masses.  MSK: Muscle tone and strength appropriate for age, w/o atrophy or abnormal movement.  EXTREMITIES: Active ROM intact, w/o tenderness, crepitus, or contracture. No obvious joint deformities or effusions. No clubbing, edema, or cyanosis.  NEUROLOGIC: CN's II-XII intact. Motor strength symmetrical with no obvious weakness. No sensory deficits. DTR's 2+ symmetric bilaterally. Steady, even gait.  PSYCH/MENTAL STATUS: Alert, oriented x 3. Cooperative, appropriate mood and affect.   Chaperoned by Cristy Hilts, CMA   Results for orders placed or performed in visit on 10/02/22  CBC with Differential/Platelet  Result Value Ref Range   WBC 6.2 3.4 - 10.8 x10E3/uL   RBC 4.86 3.77 - 5.28 x10E6/uL   Hemoglobin 13.8 11.1 - 15.9 g/dL   Hematocrit 33.2 95.1 - 46.6 %   MCV 87 79 - 97 fL   MCH 28.4 26.6 - 33.0 pg   MCHC 32.5 31.5 - 35.7 g/dL   RDW 88.4 16.6 - 06.3 %   Platelets 375 150 - 450 x10E3/uL   Neutrophils 52 Not Estab. %  Lymphs 34 Not Estab. %   Monocytes 12 Not Estab. %   Eos 1 Not Estab. %   Basos 1 Not Estab. %   Neutrophils Absolute 3.3 1.4 - 7.0 x10E3/uL   Lymphocytes Absolute 2.1 0.7 - 3.1 x10E3/uL   Monocytes Absolute 0.7 0.1 - 0.9 x10E3/uL   EOS (ABSOLUTE) 0.1 0.0 - 0.4 x10E3/uL   Basophils Absolute 0.0 0.0 - 0.2 x10E3/uL   Immature Granulocytes 0 Not Estab. %   Immature Grans (Abs) 0.0 0.0 - 0.1 x10E3/uL  Comprehensive metabolic panel  Result Value Ref Range   Glucose 101 (H) 70 - 99 mg/dL   BUN 15 6 - 24 mg/dL   Creatinine, Ser 4.09 0.57 - 1.00 mg/dL   eGFR 811 >91 YN/WGN/5.62   BUN/Creatinine Ratio 23 9 - 23   Sodium 139 134 - 144 mmol/L   Potassium 4.7 3.5 - 5.2 mmol/L   Chloride 101 96 - 106 mmol/L   CO2 24 20 - 29 mmol/L   Calcium 9.7 8.7 - 10.2 mg/dL   Total Protein 6.4 6.0 - 8.5 g/dL   Albumin 4.2 3.8 - 4.9 g/dL   Globulin, Total 2.2 1.5 - 4.5 g/dL   Bilirubin Total 0.4 0.0 - 1.2 mg/dL   Alkaline Phosphatase 74  44 - 121 IU/L   AST 16 0 - 40 IU/L   ALT 20 0 - 32 IU/L  Lipid panel  Result Value Ref Range   Cholesterol, Total 259 (H) 100 - 199 mg/dL   Triglycerides 130 0 - 149 mg/dL   HDL 77 >86 mg/dL   VLDL Cholesterol Cal 19 5 - 40 mg/dL   LDL Chol Calc (NIH) 578 (H) 0 - 99 mg/dL   Chol/HDL Ratio 3.4 0.0 - 4.4 ratio  Hemoglobin A1c  Result Value Ref Range   Hgb A1c MFr Bld 5.6 4.8 - 5.6 %   Est. average glucose Bld gHb Est-mCnc 114 mg/dL  TSH  Result Value Ref Range   TSH 2.380 0.450 - 4.500 uIU/mL    Assessment & Plan:  ***  No orders of the defined types were placed in this encounter.   PATIENT COUNSELING:  - Encouraged a healthy well-balanced diet. Patient may adjust caloric intake to maintain or achieve ideal body weight. May reduce intake of dietary saturated fat and total fat and have adequate dietary potassium and calcium preferably from fresh fruits, vegetables, and low-fat dairy products.   - Advised to avoid cigarette smoking. - Discussed with the patient that most people either abstain from alcohol or drink within safe limits (<=14/week and <=4 drinks/occasion for males, <=7/weeks and <= 3 drinks/occasion for females) and that the risk for alcohol disorders and other health effects rises proportionally with the number of drinks per week and how often a drinker exceeds daily limits. - Discussed cessation/primary prevention of drug use and availability of treatment for abuse.  - Discussed sexually transmitted diseases, avoidance of unintended pregnancy and contraceptive alternatives.  - Stressed the importance of regular exercise - Injury prevention: Discussed safety belts, safety helmets, smoke detector, smoking near bedding or upholstery.  - Dental health: Discussed importance of regular tooth brushing, flossing, and dental visits.   NEXT PREVENTATIVE PHYSICAL DUE IN 1 YEAR.  No follow-ups on file.  Patient to reach out to office if new, worrisome, or unresolved symptoms  arise or if no improvement in patient's condition. Patient verbalized understanding and is agreeable to treatment plan. All questions answered to patient's satisfaction.    Dorthy Cooler  Haywood Lasso, FNP

## 2022-10-15 ENCOUNTER — Telehealth (HOSPITAL_BASED_OUTPATIENT_CLINIC_OR_DEPARTMENT_OTHER): Payer: Self-pay | Admitting: Family Medicine

## 2022-10-15 ENCOUNTER — Encounter (HOSPITAL_BASED_OUTPATIENT_CLINIC_OR_DEPARTMENT_OTHER): Payer: BC Managed Care – PPO | Admitting: Family Medicine

## 2022-10-15 NOTE — Telephone Encounter (Signed)
Lvm to reschedule missed appt for physical and pap

## 2022-10-24 NOTE — Progress Notes (Signed)
Labs within normal limits.  Patient to call and reschedule for physical and lab review in person.  Cholesterol slightly elevated and would benefit from dietary changes and exercise.  She still needs lab work completed regarding the mouth lesions.

## 2023-01-02 DIAGNOSIS — F4321 Adjustment disorder with depressed mood: Secondary | ICD-10-CM | POA: Diagnosis not present

## 2023-01-02 DIAGNOSIS — F900 Attention-deficit hyperactivity disorder, predominantly inattentive type: Secondary | ICD-10-CM | POA: Diagnosis not present

## 2023-01-02 DIAGNOSIS — F410 Panic disorder [episodic paroxysmal anxiety] without agoraphobia: Secondary | ICD-10-CM | POA: Diagnosis not present

## 2023-01-02 DIAGNOSIS — F5101 Primary insomnia: Secondary | ICD-10-CM | POA: Diagnosis not present

## 2023-01-09 DIAGNOSIS — F332 Major depressive disorder, recurrent severe without psychotic features: Secondary | ICD-10-CM | POA: Diagnosis not present

## 2023-02-10 DIAGNOSIS — F332 Major depressive disorder, recurrent severe without psychotic features: Secondary | ICD-10-CM | POA: Diagnosis not present

## 2023-02-10 DIAGNOSIS — F900 Attention-deficit hyperactivity disorder, predominantly inattentive type: Secondary | ICD-10-CM | POA: Diagnosis not present

## 2023-02-10 DIAGNOSIS — F410 Panic disorder [episodic paroxysmal anxiety] without agoraphobia: Secondary | ICD-10-CM | POA: Diagnosis not present

## 2023-02-17 ENCOUNTER — Encounter (HOSPITAL_BASED_OUTPATIENT_CLINIC_OR_DEPARTMENT_OTHER): Payer: Self-pay | Admitting: Family Medicine

## 2023-02-17 DIAGNOSIS — F332 Major depressive disorder, recurrent severe without psychotic features: Secondary | ICD-10-CM | POA: Diagnosis not present

## 2023-02-17 NOTE — Telephone Encounter (Signed)
Attempted to call pt to discuss Care Gaps so we can try to get pt's chart updated. Unable to reach. Left pt a detailed message to call the office and have also sent pt a mychart message. Will await response/phone call back.

## 2023-02-19 DIAGNOSIS — F332 Major depressive disorder, recurrent severe without psychotic features: Secondary | ICD-10-CM | POA: Diagnosis not present

## 2023-02-26 DIAGNOSIS — F332 Major depressive disorder, recurrent severe without psychotic features: Secondary | ICD-10-CM | POA: Diagnosis not present

## 2023-02-28 DIAGNOSIS — F332 Major depressive disorder, recurrent severe without psychotic features: Secondary | ICD-10-CM | POA: Diagnosis not present

## 2023-03-08 ENCOUNTER — Other Ambulatory Visit (HOSPITAL_BASED_OUTPATIENT_CLINIC_OR_DEPARTMENT_OTHER): Payer: Self-pay | Admitting: Family Medicine

## 2023-03-08 DIAGNOSIS — K1370 Unspecified lesions of oral mucosa: Secondary | ICD-10-CM

## 2023-03-10 ENCOUNTER — Encounter (HOSPITAL_BASED_OUTPATIENT_CLINIC_OR_DEPARTMENT_OTHER): Payer: Self-pay | Admitting: *Deleted
